# Patient Record
Sex: Male | Born: 1970 | Hispanic: Yes | Marital: Married | Smoking: Former smoker
Health system: Southern US, Community
[De-identification: ages and names within clinical notes are randomized; demographics above are authoritative.]

## PROBLEM LIST (undated history)

## (undated) DIAGNOSIS — B20 Human immunodeficiency virus [HIV] disease: Secondary | ICD-10-CM

## (undated) DIAGNOSIS — E785 Hyperlipidemia, unspecified: Secondary | ICD-10-CM

## (undated) DIAGNOSIS — R7612 Nonspecific reaction to cell mediated immunity measurement of gamma interferon antigen response without active tuberculosis: Secondary | ICD-10-CM

## (undated) DIAGNOSIS — R030 Elevated blood-pressure reading, without diagnosis of hypertension: Secondary | ICD-10-CM

## (undated) HISTORY — DX: Elevated blood-pressure reading, without diagnosis of hypertension: R03.0

## (undated) HISTORY — DX: Hyperlipidemia, unspecified: E78.5

## (undated) HISTORY — DX: Human immunodeficiency virus (HIV) disease: B20

## (undated) HISTORY — DX: Nonspecific reaction to cell mediated immunity measurement of gamma interferon antigen response without active tuberculosis: R76.12

---

## 2016-04-12 HISTORY — PX: BACK SURGERY: SHX140

## 2017-05-12 ENCOUNTER — Telehealth: Payer: Self-pay

## 2017-05-12 NOTE — Telephone Encounter (Signed)
Medical records received from [place]. Patient contacted regarding new patient appointment.  He will need to schedule appointment with financial counselor prior to visit.   Laurell Josephsammy K Craigory Toste, RN

## 2017-05-18 ENCOUNTER — Other Ambulatory Visit: Payer: Self-pay

## 2017-05-18 ENCOUNTER — Other Ambulatory Visit (HOSPITAL_COMMUNITY)
Admission: RE | Admit: 2017-05-18 | Discharge: 2017-05-18 | Disposition: A | Discharge status: Home / Self Care | Payer: Medicaid Other | Source: Ambulatory Visit | Attending: Infectious Diseases | Admitting: Infectious Diseases

## 2017-05-18 DIAGNOSIS — Z113 Encounter for screening for infections with a predominantly sexual mode of transmission: Secondary | ICD-10-CM

## 2017-05-18 DIAGNOSIS — B20 Human immunodeficiency virus [HIV] disease: Secondary | ICD-10-CM

## 2017-05-18 DIAGNOSIS — Z79899 Other long term (current) drug therapy: Secondary | ICD-10-CM

## 2017-05-18 LAB — CBC WITH DIFFERENTIAL/PLATELET
BASOS ABS: 44 {cells}/uL (ref 0–200)
BASOS PCT: 1 %
EOS ABS: 132 {cells}/uL (ref 15–500)
EOS PCT: 3 %
HCT: 40.5 % (ref 38.5–50.0)
HEMOGLOBIN: 12.6 g/dL — AB (ref 13.2–17.1)
LYMPHS ABS: 1628 {cells}/uL (ref 850–3900)
Lymphocytes Relative: 37 %
MCH: 26.3 pg — AB (ref 27.0–33.0)
MCHC: 31.1 g/dL — ABNORMAL LOW (ref 32.0–36.0)
MCV: 84.4 fL (ref 80.0–100.0)
MPV: 10.7 fL (ref 7.5–12.5)
Monocytes Absolute: 352 cells/uL (ref 200–950)
Monocytes Relative: 8 %
NEUTROS ABS: 2244 {cells}/uL (ref 1500–7800)
Neutrophils Relative %: 51 %
Platelets: 284 10*3/uL (ref 140–400)
RBC: 4.8 MIL/uL (ref 4.20–5.80)
RDW: 15.3 % — ABNORMAL HIGH (ref 11.0–15.0)
WBC: 4.4 10*3/uL (ref 3.8–10.8)

## 2017-05-18 LAB — URINALYSIS
BILIRUBIN URINE: NEGATIVE
Hgb urine dipstick: NEGATIVE
Ketones, ur: NEGATIVE
LEUKOCYTES UA: NEGATIVE
NITRITE: NEGATIVE
PROTEIN: NEGATIVE
SPECIFIC GRAVITY, URINE: 1.029 (ref 1.001–1.035)
pH: 6.5 (ref 5.0–8.0)

## 2017-05-19 LAB — HEPATITIS B SURFACE ANTIBODY,QUALITATIVE: HEP B S AB: NEGATIVE

## 2017-05-19 LAB — HEPATITIS B CORE ANTIBODY, TOTAL: Hep B Core Total Ab: NONREACTIVE

## 2017-05-19 LAB — COMPLETE METABOLIC PANEL WITH GFR
ALBUMIN: 4.3 g/dL (ref 3.6–5.1)
ALK PHOS: 97 U/L (ref 40–115)
ALT: 28 U/L (ref 9–46)
AST: 28 U/L (ref 10–40)
BILIRUBIN TOTAL: 0.3 mg/dL (ref 0.2–1.2)
BUN: 15 mg/dL (ref 7–25)
CO2: 25 mmol/L (ref 20–31)
CREATININE: 0.84 mg/dL (ref 0.60–1.35)
Calcium: 9.3 mg/dL (ref 8.6–10.3)
Chloride: 101 mmol/L (ref 98–110)
GFR, Est African American: >89 mL/min (ref >=60)
GLUCOSE: 99 mg/dL (ref 65–99)
Potassium: 4.5 mmol/L (ref 3.5–5.3)
SODIUM: 138 mmol/L (ref 135–146)
TOTAL PROTEIN: 7.5 g/dL (ref 6.1–8.1)

## 2017-05-19 LAB — T-HELPER CELL (CD4) - (RCID CLINIC ONLY)
CD4 % Helper T Cell: 29 % — ABNORMAL LOW (ref 33–55)
CD4 T Cell Abs: 470 /uL (ref 400–2700)

## 2017-05-19 LAB — LIPID PANEL
CHOL/HDL RATIO: 4 ratio (ref <5.0)
CHOLESTEROL: 214 mg/dL — AB (ref <200)
HDL: 54 mg/dL (ref >40)
LDL Cholesterol: 137 mg/dL — ABNORMAL HIGH (ref <100)
TRIGLYCERIDES: 115 mg/dL (ref <150)
VLDL: 23 mg/dL (ref <30)

## 2017-05-19 LAB — HEPATITIS C ANTIBODY: HCV Ab: NEGATIVE

## 2017-05-19 LAB — URINE CYTOLOGY ANCILLARY ONLY
Chlamydia: NEGATIVE
Neisseria Gonorrhea: NEGATIVE

## 2017-05-19 LAB — HEPATITIS A ANTIBODY, TOTAL: HEP A TOTAL AB: REACTIVE — AB

## 2017-05-19 LAB — HEPATITIS B SURFACE ANTIGEN: Hepatitis B Surface Ag: NEGATIVE

## 2017-05-19 LAB — RPR

## 2017-05-20 LAB — QUANTIFERON TB GOLD ASSAY (BLOOD)
Interferon Gamma Release Assay: POSITIVE — AB
Mitogen-Nil: 8.09 IU/mL
Quantiferon Nil Value: 0.07 IU/mL
Quantiferon Tb Ag Minus Nil Value: 2.13 IU/mL

## 2017-05-22 ENCOUNTER — Encounter: Payer: Self-pay | Admitting: Infectious Diseases

## 2017-05-25 ENCOUNTER — Ambulatory Visit (INDEPENDENT_AMBULATORY_CARE_PROVIDER_SITE_OTHER): Payer: Medicaid Other | Admitting: Infectious Diseases

## 2017-05-25 ENCOUNTER — Encounter: Payer: Self-pay | Admitting: Infectious Diseases

## 2017-05-25 VITALS — BP 150/90 | HR 73 | Temp 98.3°F | Wt 218.0 lb

## 2017-05-25 DIAGNOSIS — R7612 Nonspecific reaction to cell mediated immunity measurement of gamma interferon antigen response without active tuberculosis: Secondary | ICD-10-CM | POA: Diagnosis not present

## 2017-05-25 DIAGNOSIS — Z227 Latent tuberculosis: Secondary | ICD-10-CM | POA: Insufficient documentation

## 2017-05-25 DIAGNOSIS — E7800 Pure hypercholesterolemia, unspecified: Secondary | ICD-10-CM | POA: Insufficient documentation

## 2017-05-25 DIAGNOSIS — B20 Human immunodeficiency virus [HIV] disease: Secondary | ICD-10-CM | POA: Diagnosis not present

## 2017-05-25 DIAGNOSIS — G8929 Other chronic pain: Secondary | ICD-10-CM | POA: Diagnosis not present

## 2017-05-25 DIAGNOSIS — Z21 Asymptomatic human immunodeficiency virus [HIV] infection status: Secondary | ICD-10-CM

## 2017-05-25 HISTORY — DX: Asymptomatic human immunodeficiency virus (hiv) infection status: Z21

## 2017-05-25 HISTORY — DX: Human immunodeficiency virus (HIV) disease: B20

## 2017-05-25 HISTORY — DX: Nonspecific reaction to cell mediated immunity measurement of gamma interferon antigen response without active tuberculosis: R76.12

## 2017-05-25 LAB — HLA B*5701: HLA-B 5701 W/RFLX HLA-B HIGH: POSITIVE — AB

## 2017-05-25 LAB — HIV-1 RNA,QN PCR W/REFLEX GENOTYPE
HIV-1 RNA, QN PCR: 2.39 {Log_copies}/mL — AB
HIV-1 RNA, QN PCR: 247 {copies}/mL — AB

## 2017-05-25 MED ORDER — EMTRICITABINE-TENOFOVIR AF 200-25 MG PO TABS: 1.0000 | ORAL_TABLET | Freq: Every day | ORAL | 5 refills | Status: DC

## 2017-05-25 MED ORDER — GEMFIBROZIL 600 MG PO TABS: 600.0000 mg | ORAL_TABLET | Freq: Two times a day (BID) | ORAL | 2 refills | Status: DC

## 2017-05-25 MED ORDER — DARUNAVIR-COBICISTAT 800-150 MG PO TABS: 1.0000 | ORAL_TABLET | Freq: Every day | ORAL | 5 refills | Status: DC
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
## 2017-05-25 NOTE — Assessment & Plan Note (Signed)
I will continue his gemfibrozil for now and reassess lipid panel in 6 months. Overall cholesterol panel looks OK. Triglycerides are controlled on current regimen however LDL 137. May need to change to low dose statin for better LDL reduction.

## 2017-05-25 NOTE — Patient Instructions (Addendum)
Stop taking your RITONAVIR, TRUVADA, ATAZANAVIR.   We are going to start you on 2 new medications today PREZCOBIX and DESCOVY.   Please take these medications once a day together with a full meal.   We will see you back in 3 weeks to see how you are doing with your medications.

## 2017-05-25 NOTE — Assessment & Plan Note (Addendum)
Uncertain if he has had treatment for LTBI in the past or not. Based on his report it seems as if he possibly tried treatment but was not tolerant. Will query previous clinic if there is more information regarding this. For now no active symptoms. CXR last in 2017 that was negative by report. Will order CXR for baseline assessment here.

## 2017-05-25 NOTE — Assessment & Plan Note (Addendum)
In looking at recent lab work from [place] he has had low level viremia  ~ 100 - 110 copies on current regimen. Reports excellent adherence prior to finishing last available medication. VL and genotype still pending. I have requested further records re: resistance testing from [place] clinic. Currently awaiting VL and genotype from blood work last week. Will transition him to Descovy/Prezcobix for now. Discussed PrEP possibility for his wife--currently uninsured but seeking options for coverage now. Will discuss again at future visits. Encouraged abstinence while off medications to protect wife from transmission of virus. Vaccination records reviewed and currently up to date.

## 2017-06-15 ENCOUNTER — Ambulatory Visit (INDEPENDENT_AMBULATORY_CARE_PROVIDER_SITE_OTHER): Payer: Medicaid Other | Admitting: Infectious Diseases

## 2017-06-15 ENCOUNTER — Ambulatory Visit
Admission: RE | Admit: 2017-06-15 | Discharge: 2017-06-15 | Disposition: A | Discharge status: Home / Self Care | Payer: Medicaid Other | Source: Ambulatory Visit | Attending: Infectious Diseases | Admitting: Infectious Diseases

## 2017-06-15 ENCOUNTER — Encounter: Payer: Self-pay | Admitting: Infectious Diseases

## 2017-06-15 VITALS — BP 165/95 | HR 61 | Temp 98.0°F | Wt 220.0 lb

## 2017-06-15 DIAGNOSIS — R030 Elevated blood-pressure reading, without diagnosis of hypertension: Secondary | ICD-10-CM | POA: Diagnosis not present

## 2017-06-15 DIAGNOSIS — G8929 Other chronic pain: Secondary | ICD-10-CM | POA: Diagnosis not present

## 2017-06-15 DIAGNOSIS — B20 Human immunodeficiency virus [HIV] disease: Secondary | ICD-10-CM | POA: Diagnosis present

## 2017-06-15 DIAGNOSIS — E7800 Pure hypercholesterolemia, unspecified: Secondary | ICD-10-CM

## 2017-06-15 DIAGNOSIS — R7611 Nonspecific reaction to tuberculin skin test without active tuberculosis: Secondary | ICD-10-CM

## 2017-06-15 DIAGNOSIS — R7612 Nonspecific reaction to cell mediated immunity measurement of gamma interferon antigen response without active tuberculosis: Secondary | ICD-10-CM

## 2017-06-15 DIAGNOSIS — I10 Essential (primary) hypertension: Secondary | ICD-10-CM | POA: Insufficient documentation

## 2017-06-15 DIAGNOSIS — Z227 Latent tuberculosis: Secondary | ICD-10-CM

## 2017-06-15 HISTORY — DX: Elevated blood-pressure reading, without diagnosis of hypertension: R03.0

## 2017-06-15 LAB — LIPID PANEL
CHOL/HDL RATIO: 4.5 ratio (ref <5.0)
CHOLESTEROL: 236 mg/dL — AB (ref <200)
HDL: 53 mg/dL (ref >40)
LDL Cholesterol: 161 mg/dL — ABNORMAL HIGH (ref <100)
Triglycerides: 110 mg/dL (ref <150)
VLDL: 22 mg/dL (ref <30)

## 2017-06-15 MED ORDER — GABAPENTIN 300 MG PO CAPS: 300.0000 mg | ORAL_CAPSULE | Freq: Two times a day (BID) | ORAL | 3 refills | Status: DC

## 2017-06-15 MED ORDER — PAROXETINE HCL 20 MG PO TABS: 20.0000 mg | ORAL_TABLET | Freq: Every day | ORAL | 5 refills | Status: DC

## 2017-06-15 NOTE — Progress Notes (Signed)
PCP - Patient, No Pcp Per   Patient Active Problem List   Diagnosis Date Noted  . Elevated blood pressure reading 06/15/2017  . HIV (human immunodeficiency virus infection) (HCC) 05/25/2017  . Hypercholesteremia 05/25/2017  . 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 Swallow whole. Do NOT crush, break or chew tablets. Take with food.   EMTRICITABINE-TENOFOVIR AF (DESCOVY) 200-25 MG TABLET    Take 1 tablet by mouth daily.   GEMFIBROZIL (LOPID) 600 MG TABLET    Take 1 tablet (600 mg total) by mouth 2 (two) times daily before a meal.   OXYCODONE-ACETAMINOPHEN (PERCOCET) 10-325 MG TABLET    Take 1 tablet by mouth every 4 (four) hours as needed for pain.  Modified Medications   Modified Medication Previous Medication   GABAPENTIN (NEURONTIN) 300 MG CAPSULE gabapentin (NEURONTIN) 300 MG capsule      Take 1 capsule (300 mg total) by mouth 2 (two) times daily.    Take 300 mg by mouth 2 (two) times daily.   PAROXETINE (PAXIL) 20 MG TABLET PARoxetine (PAXIL) 20 MG tablet      Take 1 tablet (20 mg total) by mouth daily.    Take 20 mg by mouth daily.  Discontinued Medications   No medications on file    Subjective: Wille Kneebone presents to clinic today for routine follow up care for his HIV infection.  HPI:  HIV =  Currently on a regimen of Descovy and Prezcobix and is tolerating it well. Over the last 30 days he has missed no doses and is taking it with food. No concerns regarding medications or HIV disease. Symptoms associated with his HIV today include none. Last viral load was just above 200 and CD4 counts have been excellent. Requesting assistance with dental needs today. Infrequent condom use with his wife whom is HIV negative. No new sexual partners.   Health Maintenance = reviewed  vaccines and age recommended screenings today with [place]. Denies smoking and excessive alcohol use. No formal exercise. High blood pressure previously but does not recall medication use or not.   Review of Systems  Constitutional: Negative for chills, fever, malaise/fatigue and weight loss.  HENT: Negative for sore throat.        Reports loosening of bridge and needing to "glue" things back into place.   Respiratory: Negative for cough and sputum production.   Cardiovascular: Negative for chest pain and leg swelling.  Gastrointestinal: Negative for abdominal pain, diarrhea and vomiting.  Genitourinary: Negative for dysuria and flank pain.  Musculoskeletal: Negative for joint pain, myalgias and neck pain.  Skin: Negative for rash.  Neurological: Negative for dizziness, tingling and headaches.  Psychiatric/Behavioral: Negative for depression and substance abuse. The patient is not nervous/anxious and does not have insomnia.     Past Medical History:  Diagnosis Date  . 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 Elevated blood pressure reading 06/15/2017  . HIV (human immunodeficiency virus infection) (HCC) 05/25/2017   Dx 2005  . Hyperlipidemia     Allergies  Allergen Reactions  . Other     Sweet Peppers and arugala-- Hives and angioedema.     Objective:  Vitals:   06/15/17 0930  BP: (!) 165/95  Pulse: 61  Temp: 98 F (36.7 C)  TempSrc: Oral  Weight: 220 lb (99.8 kg)   There is no height or weight on file to calculate BMI.  Physical Exam  Constitutional: He is oriented to person, place, and time and well-developed, well-nourished, and in no distress.  HENT:  Mouth/Throat: No oral lesions. Normal dentition. No dental caries.  Eyes: No scleral icterus.  Cardiovascular: Normal rate, regular rhythm and normal heart sounds.   Pulmonary/Chest: Effort normal and breath sounds normal.  Abdominal: Soft. He exhibits no distension. There is no  tenderness.  Lymphadenopathy:    He has no cervical adenopathy.  Neurological: He is alert and oriented to person, place, and time.  Skin: Skin is warm and dry. No rash noted.  Psychiatric: Mood and affect normal.    Lab Results Lab Results  Component Value Date   WBC 4.4 05/18/2017   HGB 12.6 (L) 05/18/2017   HCT 40.5 05/18/2017   MCV 84.4 05/18/2017   PLT 284 05/18/2017    Lab Results  Component Value Date   CREATININE 0.84 05/18/2017   BUN 15 05/18/2017   NA 138 05/18/2017   K 4.5 05/18/2017   CL 101 05/18/2017   CO2 25 05/18/2017    Lab Results  Component Value Date   ALT 28 05/18/2017   AST 28 05/18/2017   ALKPHOS 97 05/18/2017   BILITOT 0.3 05/18/2017    Lab Results  Component Value Date   CHOL 214 (H) 05/18/2017   HDL 54 05/18/2017   LDLCALC 137 (H) 05/18/2017   TRIG 115 05/18/2017   CHOLHDL 4.0 05/18/2017   CD4 T Cell Abs (/uL)  Date Value  05/18/2017 470   Lab Results  Component Value Date   HAV REACTIVE (A) 05/18/2017   Lab Results  Component Value Date   HEPBSAG NEGATIVE 05/18/2017   HEPBSAB NEG 05/18/2017   Lab Results  Component Value Date   HCVAB NEGATIVE 05/18/2017   Lab Results  Component Value Date   CHLAMYDIAWP Negative 05/18/2017   N Negative 05/18/2017   No results found for: GCPROBEAPT Lab Results  Component Value Date   QUANTGOLD POSITIVE (A) 05/18/2017   No results found for: RPR    Problem List Items Addressed This Visit      Other   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 CXR negative at that time. Completed INH x 5088m on 08/26/2010. Will get baseline CXR today. No active pulmonary symptoms today.       Hypercholesteremia    Continues Gemfibrozil. Will reassess with next lab-draw in 4 months since his ART regimen has changed. Overall CV risk reduction low being his HDL is good and non-smoker. May need to  change to statin to get more LDL reduction.       Relevant Orders   Lipid panel   Ambulatory referral to Internal Medicine   HIV (human immunodeficiency virus infection) (HCC) - Primary (Chronic)    Tolerating his Descovy / Prezcobix well and reports excellent adherence. Will continue these medications until he is suppressed and consider switching to STR to help his cholesterol profile. No genosure available through previous clinic records. Will get him dental visit scheduled for dental complaints.       Relevant Orders   HIV 1 RNA quant-no reflex-bld   T-helper cell (CD4)- ([facility] clinic only)   Elevated blood pressure reading    BP elevated today as well  as previous visit a few weeks ago. If elevated at next visit will initiate medication for him to lower overall CV risk. Encouraged to monitor periodically at pharmacy / [facility] outside of medical setting to get better idea of trends day to day.       Relevant Orders   Ambulatory referral to Internal Medicine    Other Visit Diagnoses    Other chronic pain       Relevant Medications   PARoxetine (PAXIL) 20 MG tablet   gabapentin (NEURONTIN) 300 MG capsule   Other Relevant Orders   Ambulatory referral to Internal Medicine     Requesting assistance for non-HIV medications today as his work has been slow. Provided with Good Rx card. He has [place] Medicaid. I have placed referral to internal medicine to establish for primary care needs. I have refilled his Paxil and Neurontin for generic rx until he establishes care. He reports he is also taking opoid although uncertain as to which one. Review of [facility] shows 2 fills since April 21 2017, last July 18th for Oxycodone 10 mg #120 by Oval Linsey, MD - advised that I cannot refill this medication.    Rexene Alberts, MSN, NP-C [facility]  06/15/17 12:19 PM

## 2017-06-15 NOTE — Patient Instructions (Addendum)
Nice to see you today   Continue taking your Descovy and Prezcobix every day. ALWAYS take them together with food.   We will see you back in 3 months with labs 2 weeks before - please come fasting (no foods 8 hours before your labs)

## 2017-06-15 NOTE — Assessment & Plan Note (Addendum)
BP elevated today as well as previous visit a few weeks ago. If elevated at next visit will initiate medication for him to lower overall CV risk. Encouraged to monitor periodically at pharmacy / [facility] outside of medical setting to get better idea of trends day to day.

## 2017-06-15 NOTE — Assessment & Plan Note (Signed)
Continues Gemfibrozil. Will reassess with next lab-draw in 4 months since his ART regimen has changed. Overall CV risk reduction low being his HDL is good and non-smoker. May need to change to statin to get more LDL reduction.

## 2017-06-15 NOTE — Assessment & Plan Note (Addendum)
Tolerating his Descovy / Prezcobix well and reports excellent adherence. Will continue these medications until he is suppressed and consider switching to STR to help his cholesterol profile. No genosure available through previous clinic records. Will get him dental visit scheduled for dental complaints.

## 2017-06-15 NOTE — Assessment & Plan Note (Signed)
[facility] [place] proving he had a positive TST 06/09/2008 @ 20mm. CXR negative at that time. Completed INH x 7327m on 08/26/2010. Will get baseline CXR today. No active pulmonary symptoms today.

## 2017-06-16 LAB — T-HELPER CELL (CD4) - (RCID CLINIC ONLY)
CD4 T CELL ABS: 400 /uL (ref 400–2700)
CD4 T CELL HELPER: 27 % — AB (ref 33–55)

## 2017-06-17 ENCOUNTER — Telehealth: Payer: Self-pay | Admitting: *Deleted

## 2017-06-17 LAB — HIV-1 RNA QUANT-NO REFLEX-BLD
HIV 1 RNA QUANT: DETECTED {copies}/mL — AB
HIV-1 RNA QUANT, LOG: DETECTED {Log_copies}/mL — AB

## 2017-06-17 NOTE — Telephone Encounter (Signed)
Call from patient stating he went to the pharmacy to pick up his meds and the oxycodone was not there. Advised that this medication was never prescribed by this office and that his NP explained to him at the visit that she would not be able to refill it. He misunderstood and thanked me and accepted this explanation.

## 2017-07-21 ENCOUNTER — Other Ambulatory Visit (HOSPITAL_COMMUNITY): Payer: Self-pay | Admitting: Family Medicine

## 2017-07-21 ENCOUNTER — Ambulatory Visit (HOSPITAL_COMMUNITY)
Admission: RE | Admit: 2017-07-21 | Discharge: 2017-07-21 | Disposition: A | Discharge status: Home / Self Care | Payer: Medicaid Other | Source: Ambulatory Visit | Attending: Family Medicine | Admitting: Family Medicine

## 2017-07-21 DIAGNOSIS — G8929 Other chronic pain: Secondary | ICD-10-CM | POA: Insufficient documentation

## 2017-07-21 DIAGNOSIS — M4802 Spinal stenosis, cervical region: Secondary | ICD-10-CM | POA: Insufficient documentation

## 2017-07-21 DIAGNOSIS — M5031 Other cervical disc degeneration,  high cervical region: Secondary | ICD-10-CM | POA: Diagnosis not present

## 2017-08-28 ENCOUNTER — Other Ambulatory Visit: Payer: Self-pay | Admitting: Infectious Diseases

## 2017-08-28 DIAGNOSIS — E7800 Pure hypercholesterolemia, unspecified: Secondary | ICD-10-CM

## 2017-11-06 ENCOUNTER — Other Ambulatory Visit: Payer: Self-pay | Admitting: Infectious Diseases

## 2017-11-06 DIAGNOSIS — B20 Human immunodeficiency virus [HIV] disease: Secondary | ICD-10-CM

## 2017-12-30 ENCOUNTER — Ambulatory Visit (HOSPITAL_COMMUNITY)
Admission: RE | Admit: 2017-12-30 | Discharge: 2017-12-30 | Disposition: A | Discharge status: Home / Self Care | Payer: Medicaid Other | Source: Ambulatory Visit | Attending: Family Medicine | Admitting: Family Medicine

## 2017-12-30 ENCOUNTER — Other Ambulatory Visit (HOSPITAL_COMMUNITY): Payer: Self-pay | Admitting: Family Medicine

## 2017-12-30 DIAGNOSIS — M1711 Unilateral primary osteoarthritis, right knee: Secondary | ICD-10-CM

## 2017-12-30 DIAGNOSIS — G8929 Other chronic pain: Secondary | ICD-10-CM | POA: Diagnosis present

## 2017-12-30 DIAGNOSIS — M25561 Pain in right knee: Secondary | ICD-10-CM | POA: Insufficient documentation

## 2018-02-06 ENCOUNTER — Other Ambulatory Visit: Payer: Self-pay | Admitting: Infectious Diseases

## 2018-02-06 DIAGNOSIS — E7800 Pure hypercholesterolemia, unspecified: Secondary | ICD-10-CM

## 2018-03-11 ENCOUNTER — Other Ambulatory Visit: Payer: Self-pay | Admitting: Infectious Diseases

## 2018-03-11 DIAGNOSIS — E7800 Pure hypercholesterolemia, unspecified: Secondary | ICD-10-CM

## 2018-03-23 IMAGING — DX DG CHEST 2V
2 series · 2 of 2 positions shown · non-contrast
Comparison: None.

CLINICAL DATA: Positive QuantiFERON TB test. No active pulmonary
symptoms. Baseline assessment.

EXAM:
CHEST  2 VIEW

[dg chest 2 view (1 of 2)]
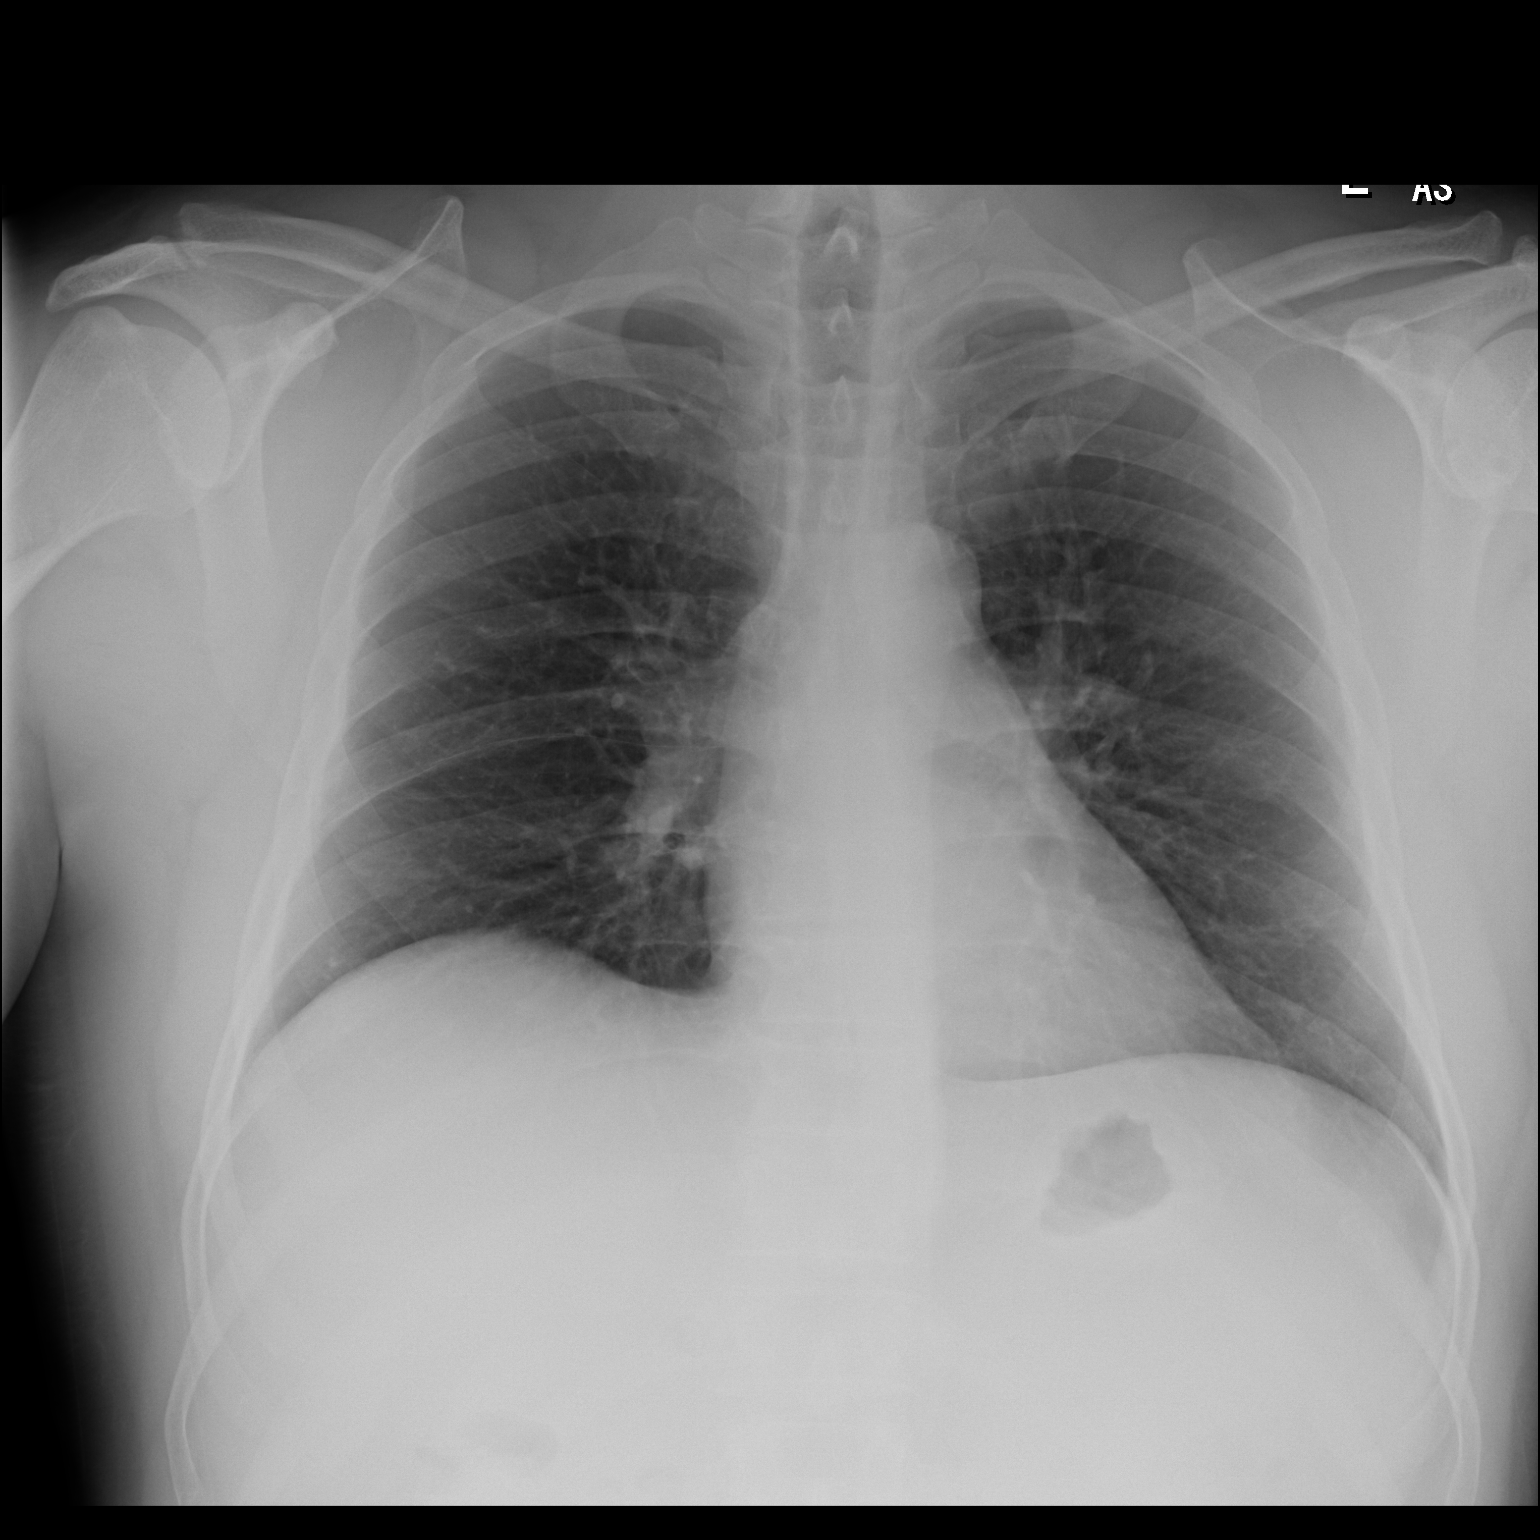

[dg chest 2 view (2 of 2)]
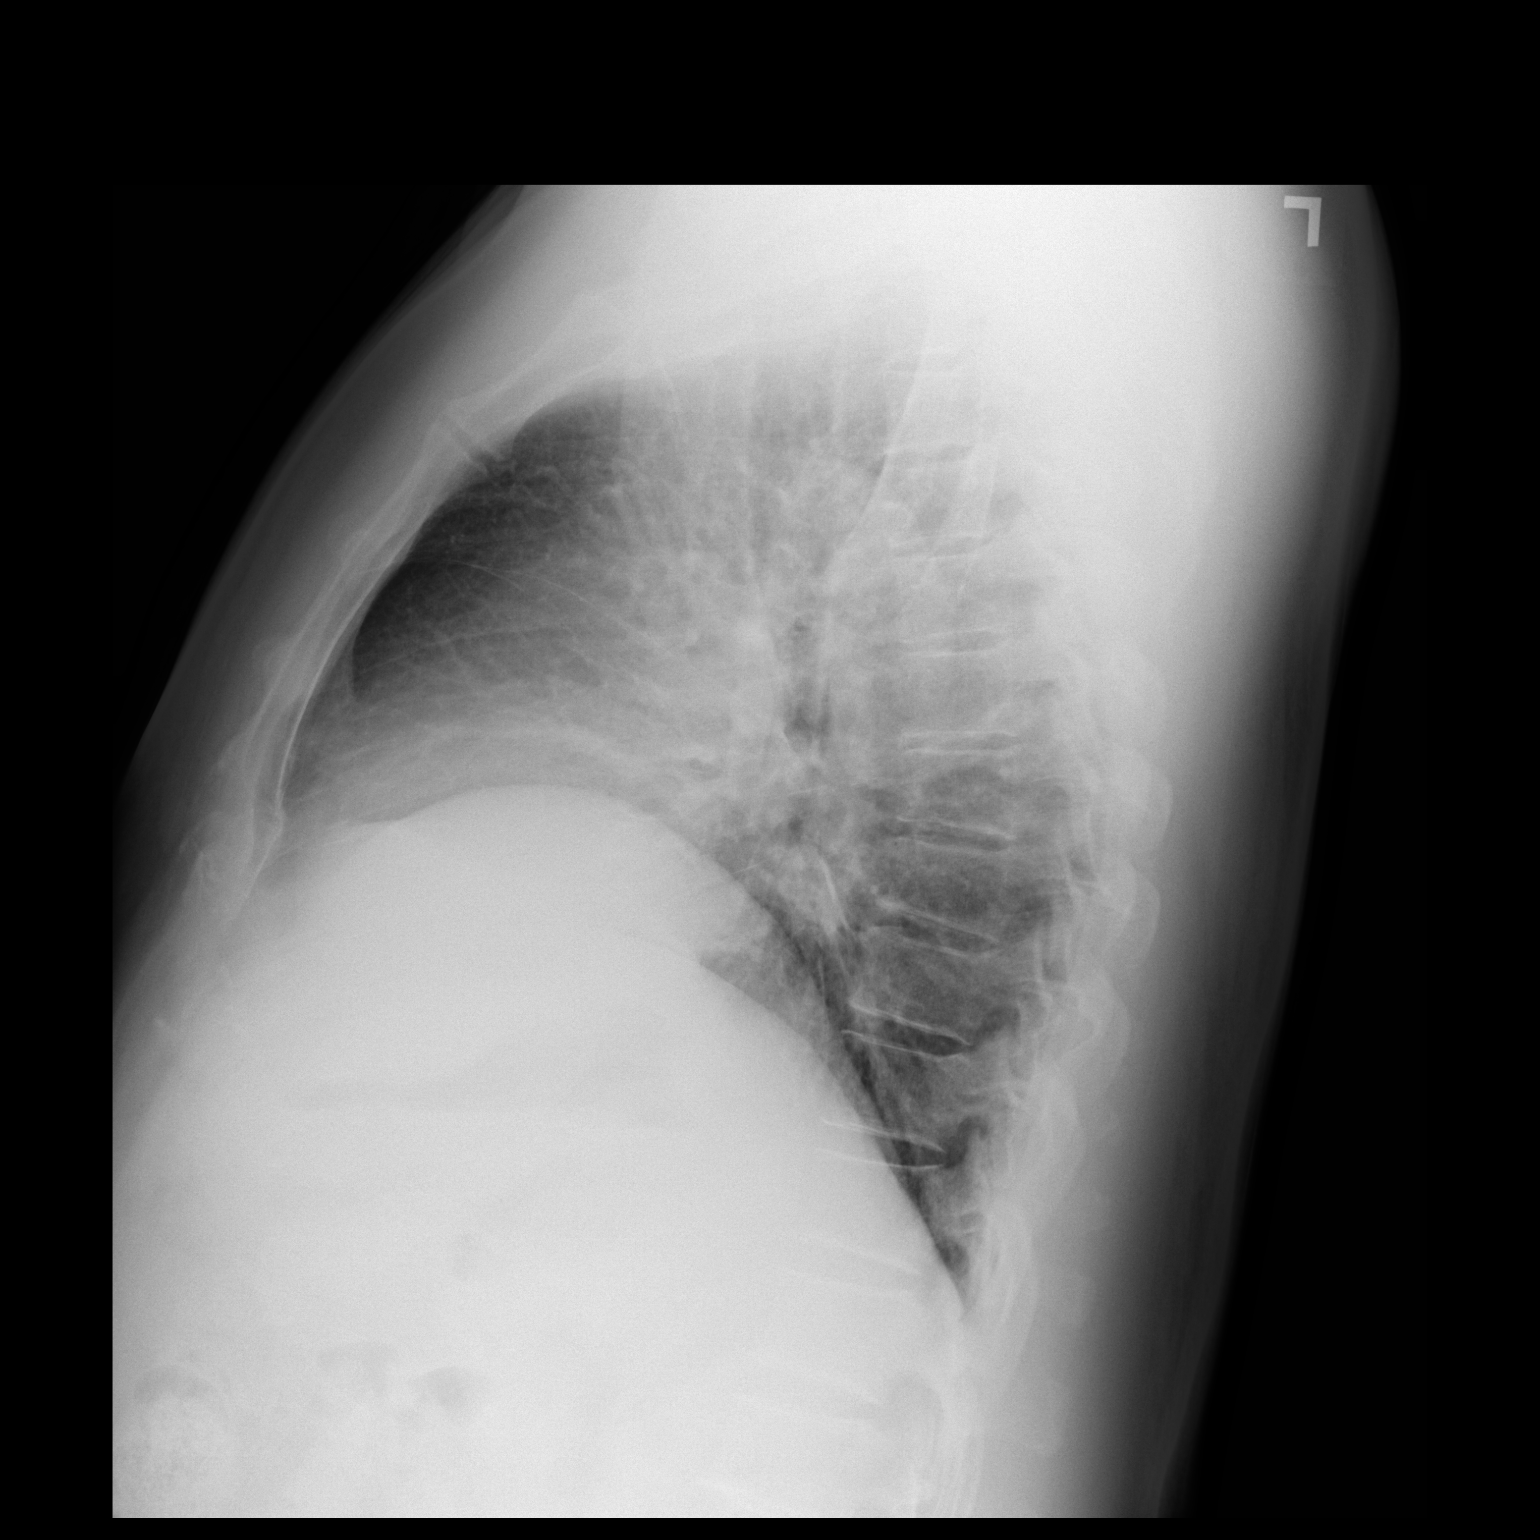

[2 of 2 positions shown; findings below may reference images not displayed]

FINDINGS: Hypoinspiratory lateral view. Normal heart size. Normal mediastinal
contour. No pneumothorax. No pleural effusion. Lungs appear clear,
with no acute consolidative airspace disease and no pulmonary edema.
IMPRESSION: No active cardiopulmonary disease.

## 2018-03-31 ENCOUNTER — Other Ambulatory Visit: Payer: Medicaid Other

## 2018-03-31 ENCOUNTER — Other Ambulatory Visit: Payer: Self-pay | Admitting: Behavioral Health

## 2018-03-31 ENCOUNTER — Other Ambulatory Visit (HOSPITAL_COMMUNITY)
Admission: RE | Admit: 2018-03-31 | Discharge: 2018-03-31 | Disposition: A | Discharge status: Home / Self Care | Payer: Medicaid Other | Source: Ambulatory Visit | Attending: Infectious Diseases | Admitting: Infectious Diseases

## 2018-03-31 DIAGNOSIS — Z113 Encounter for screening for infections with a predominantly sexual mode of transmission: Secondary | ICD-10-CM | POA: Insufficient documentation

## 2018-03-31 DIAGNOSIS — E7800 Pure hypercholesterolemia, unspecified: Secondary | ICD-10-CM

## 2018-03-31 DIAGNOSIS — B20 Human immunodeficiency virus [HIV] disease: Secondary | ICD-10-CM

## 2018-03-31 MED ORDER — EMTRICITABINE-TENOFOVIR AF 200-25 MG PO TABS
1.0000 | ORAL_TABLET | Freq: Every day | ORAL | 0 refills | Status: DC
Start: 2018-03-31 — End: 2018-04-12

## 2018-03-31 MED ORDER — GEMFIBROZIL 600 MG PO TABS: ORAL_TABLET | ORAL | 0 refills | Status: DC

## 2018-03-31 MED ORDER — DARUNAVIR-COBICISTAT 800-150 MG PO TABS: ORAL_TABLET | ORAL | 0 refills | Status: DC

## 2018-03-31 NOTE — Progress Notes (Signed)
Patient came in today for labs and stated he needed refills on his medications.  Patient states he has not missed any doses of currently HIV medications.  Patient also inquired about percocet which he receives from his primary care doctor.  Patient states his current dosage is not strong enough.  Writer informed him to talk with his primary care doctor who manages his current dosage of percocet. Patient has a scheduled f/u appointment with Rexene Alberts 04/12/2018.   Angeline Slim RN

## 2018-04-01 LAB — COMPLETE METABOLIC PANEL WITH GFR
AG RATIO: 1.5 (calc) (ref 1.0–2.5)
ALBUMIN MSPROF: 4.5 g/dL (ref 3.6–5.1)
ALT: 32 U/L (ref 9–46)
AST: 32 U/L (ref 10–40)
Alkaline phosphatase (APISO): 87 U/L (ref 40–115)
BUN: 17 mg/dL (ref 7–25)
CALCIUM: 9.5 mg/dL (ref 8.6–10.3)
CHLORIDE: 100 mmol/L (ref 98–110)
CO2: 30 mmol/L (ref 20–32)
Creat: 1.04 mg/dL (ref 0.60–1.35)
GFR, EST AFRICAN AMERICAN: 101 mL/min/{1.73_m2} (ref >=60)
GFR, EST NON AFRICAN AMERICAN: 87 mL/min/{1.73_m2} (ref >=60)
Globulin: 3 g/dL (calc) (ref 1.9–3.7)
Glucose, Bld: 142 mg/dL — ABNORMAL HIGH (ref 65–99)
Potassium: 4.1 mmol/L (ref 3.5–5.3)
Sodium: 137 mmol/L (ref 135–146)
TOTAL PROTEIN: 7.5 g/dL (ref 6.1–8.1)
Total Bilirubin: 0.4 mg/dL (ref 0.2–1.2)

## 2018-04-01 LAB — CBC WITH DIFFERENTIAL/PLATELET
BASOS ABS: 50 {cells}/uL (ref 0–200)
Basophils Relative: 0.8 %
EOS ABS: 88 {cells}/uL (ref 15–500)
EOS PCT: 1.4 %
HEMATOCRIT: 37.6 % — AB (ref 38.5–50.0)
HEMOGLOBIN: 12.3 g/dL — AB (ref 13.2–17.1)
LYMPHS ABS: 2148 {cells}/uL (ref 850–3900)
MCH: 26.3 pg — AB (ref 27.0–33.0)
MCHC: 32.7 g/dL (ref 32.0–36.0)
MCV: 80.5 fL (ref 80.0–100.0)
MPV: 12 fL (ref 7.5–12.5)
Monocytes Relative: 9.2 %
NEUTROS PCT: 54.5 %
Neutro Abs: 3434 cells/uL (ref 1500–7800)
Platelets: 283 10*3/uL (ref 140–400)
RBC: 4.67 10*6/uL (ref 4.20–5.80)
RDW: 14.5 % (ref 11.0–15.0)
Total Lymphocyte: 34.1 %
WBC mixed population: 580 cells/uL (ref 200–950)
WBC: 6.3 10*3/uL (ref 3.8–10.8)

## 2018-04-01 LAB — LIPID PANEL
CHOL/HDL RATIO: 4.2 (calc) (ref <5.0)
Cholesterol: 254 mg/dL — ABNORMAL HIGH (ref <200)
HDL: 61 mg/dL (ref >40)
LDL CHOLESTEROL (CALC): 151 mg/dL — AB
NON-HDL CHOLESTEROL (CALC): 193 mg/dL — AB (ref <130)
TRIGLYCERIDES: 254 mg/dL — AB (ref <150)

## 2018-04-01 LAB — RPR: RPR Ser Ql: NONREACTIVE

## 2018-04-01 LAB — URINE CYTOLOGY ANCILLARY ONLY
CHLAMYDIA, DNA PROBE: NEGATIVE
Neisseria Gonorrhea: NEGATIVE

## 2018-04-01 LAB — T-HELPER CELL (CD4) - (RCID CLINIC ONLY)
CD4 T CELL HELPER: 29 % — AB (ref 33–55)
CD4 T Cell Abs: 690 /uL (ref 400–2700)

## 2018-04-02 LAB — HIV-1 RNA QUANT-NO REFLEX-BLD
HIV 1 RNA QUANT: DETECTED {copies}/mL — AB
HIV-1 RNA QUANT, LOG: DETECTED {Log_copies}/mL — AB

## 2018-04-12 ENCOUNTER — Ambulatory Visit (INDEPENDENT_AMBULATORY_CARE_PROVIDER_SITE_OTHER): Payer: Medicaid Other | Admitting: Infectious Diseases

## 2018-04-12 ENCOUNTER — Encounter: Payer: Self-pay | Admitting: Infectious Diseases

## 2018-04-12 VITALS — BP 154/98 | HR 73 | Temp 98.0°F | Wt 236.0 lb

## 2018-04-12 DIAGNOSIS — E785 Hyperlipidemia, unspecified: Secondary | ICD-10-CM | POA: Diagnosis not present

## 2018-04-12 DIAGNOSIS — E7800 Pure hypercholesterolemia, unspecified: Secondary | ICD-10-CM

## 2018-04-12 DIAGNOSIS — B20 Human immunodeficiency virus [HIV] disease: Secondary | ICD-10-CM | POA: Diagnosis not present

## 2018-04-12 DIAGNOSIS — G8929 Other chronic pain: Secondary | ICD-10-CM

## 2018-04-12 DIAGNOSIS — R030 Elevated blood-pressure reading, without diagnosis of hypertension: Secondary | ICD-10-CM

## 2018-04-12 DIAGNOSIS — Z0000 Encounter for general adult medical examination without abnormal findings: Secondary | ICD-10-CM

## 2018-04-12 DIAGNOSIS — Z7185 Encounter for immunization safety counseling: Secondary | ICD-10-CM | POA: Insufficient documentation

## 2018-04-12 MED ORDER — BLOOD PRESSURE CUFF MISC: 1.0000 | Freq: Once | 0 refills | Status: AC

## 2018-04-12 MED ORDER — EMTRICITABINE-TENOFOVIR AF 200-25 MG PO TABS
1.0000 | ORAL_TABLET | Freq: Every day | ORAL | 0 refills | Status: DC
Start: 2018-04-12 — End: 2018-08-20

## 2018-04-12 MED ORDER — DARUNAVIR-COBICISTAT 800-150 MG PO TABS: ORAL_TABLET | ORAL | 11 refills | Status: DC

## 2018-04-12 MED ORDER — ROSUVASTATIN CALCIUM 5 MG PO TABS: 5.0000 mg | ORAL_TABLET | Freq: Every day | ORAL | 5 refills | Status: DC

## 2018-04-12 NOTE — Assessment & Plan Note (Signed)
Continue gabapentin 300 mg BID and paxil 20 mg QD for adjuvent pain management. Opioids are managed by pain doctor.

## 2018-04-12 NOTE — Assessment & Plan Note (Signed)
Lab Results  Component Value Date   CHOL 254 (H) 03/31/2018   HDL 61 03/31/2018   LDLCALC 151 (H) 03/31/2018   TRIG 254 (H) 03/31/2018   CHOLHDL 4.2 03/31/2018   CVD 10-year risk 10.3%. Will D/C gemfibrozil and start statin. Will initiate @ 5 mg QD for now considering he is on cobicistat for his HIV infection and interaction noted to boost effect of statin when combined to uncertain degree. (Maximum dose should be 10 mg). Discussed side effects to call to report.  Will work on getting him set up with primary care team to assist with management of this as well as his blood pressure and other conditions.

## 2018-04-12 NOTE — Assessment & Plan Note (Signed)
Will send in rx for automatic BP cuff although uncertain if Medicaid will cover this for him. I asked about the possibility of him buying one from local pharmacy as his wife also has hypertension. Would recommend monitoring at home to see what resting trend is. Likely related to weight gain. Discussed today reducing sugars/simple carbohydrates/flour products to help reduce weight. He is open to starting medication if needed. We discussed desired target < 140/90. He will notify our team if he has readings persistently above these and will start him on lisinopril/hctz combo.

## 2018-04-12 NOTE — Assessment & Plan Note (Signed)
Referral to internal medicine center for assistance in managing other conditions.  Vaccines up to date including Prevnar vaccine.  Recommended 5-10% weight loss as he has gained 20 lbs over the last 10 months.  Reduce simple carbohydrates, sodas, sugars. Increase cardiovascular exercise.
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
## 2018-04-12 NOTE — Assessment & Plan Note (Addendum)
Recent labs undetectable with Descovy + Prezcobix. I would like to get him off cobicistat if possible to avoid interactions with other medications as he likely will require other medications to treat metabolic conditions. Will see how he does with weight loss to impact some of his numbers. Discussed U=U concept with regards to he and his wife of whom they are sexually active. Risk with sustained virologic suppression essentially zero. Return in 6 months - he has not per his report (and review of pill chart) been on integrase in the past. Consider medication change at this visit.

## 2018-04-12 NOTE — Patient Instructions (Addendum)
Continue Prezcobix and Descovy once daily with food.   Would have you check your blood pressure a few times a week to see if we need to put you on blood pressure medications.   Would like to see your blood pressure less than 140/90.   Try to increase your exercise - boxing is fabulous!    I would like you to stop taking the gemfibrozil and start taking rosuvastatin once a day. This is your cholesterol.   Please come back in 6 months for fasting lab work (nothing to eat 8 hours before your test please. OK to have black coffee or water).   I would like to get you set up with a primary care provider that can help you with managing your blood pressure and cholesterol better.

## 2018-08-19 ENCOUNTER — Other Ambulatory Visit: Payer: Self-pay | Admitting: Infectious Diseases

## 2018-08-19 DIAGNOSIS — B20 Human immunodeficiency virus [HIV] disease: Secondary | ICD-10-CM

## 2018-08-23 ENCOUNTER — Telehealth: Payer: Self-pay | Admitting: Pharmacist

## 2018-08-23 DIAGNOSIS — B20 Human immunodeficiency virus [HIV] disease: Secondary | ICD-10-CM

## 2018-08-23 MED ORDER — BICTEGRAVIR-EMTRICITAB-TENOFOV 50-200-25 MG PO TABS: 1.0000 | ORAL_TABLET | Freq: Every day | ORAL | 5 refills | Status: DC

## 2018-08-23 MED FILL — BIKTARVY 50-200-25 MG TABS: 50-200-25 | 30 days supply | Qty: 30 | Fill #0

## 2018-08-23 NOTE — Addendum Note (Signed)
Addended by: Aggie Cosier L on: 08/23/2018 10:21 AM   Modules accepted: Orders

## 2018-08-23 NOTE — Telephone Encounter (Signed)
Thank you - he is seeing me again in about 6-8 weeks so can follow up viral load at this expected visit after medication switch.

## 2018-08-23 NOTE — Telephone Encounter (Signed)
Patient came in to see Olegario Messier to get ADAP and is out of Prezcobix and Descovy and will need medication assistance.  Upon looking at patient's chart, it looks like Judeth Cornfield wanted to switch him to an integrase containing regimen. Discussed with Judeth Cornfield and we will switch to [place] today. Betty applied through [place] and he got immediate approval for 30 days.

## 2018-08-24 ENCOUNTER — Encounter: Payer: Self-pay | Admitting: Infectious Diseases

## 2018-10-12 ENCOUNTER — Other Ambulatory Visit: Payer: Medicaid Other

## 2018-10-12 ENCOUNTER — Other Ambulatory Visit: Payer: Self-pay | Admitting: *Deleted

## 2018-10-12 DIAGNOSIS — B20 Human immunodeficiency virus [HIV] disease: Secondary | ICD-10-CM

## 2018-10-12 DIAGNOSIS — E785 Hyperlipidemia, unspecified: Secondary | ICD-10-CM

## 2018-10-13 LAB — T-HELPER CELL (CD4) - (RCID CLINIC ONLY)
CD4 % Helper T Cell: 24 % — ABNORMAL LOW (ref 33–55)
CD4 T Cell Abs: 360 /uL — ABNORMAL LOW (ref 400–2700)

## 2018-10-14 LAB — HIV-1 RNA QUANT-NO REFLEX-BLD
HIV 1 RNA Quant: <20 copies/mL — AB
HIV-1 RNA Quant, Log: <1.3 Log copies/mL — AB

## 2018-10-14 LAB — LIPID PANEL
CHOL/HDL RATIO: 4.3 (calc) (ref <5.0)
Cholesterol: 224 mg/dL — ABNORMAL HIGH (ref <200)
HDL: 52 mg/dL (ref >40)
LDL Cholesterol (Calc): 152 mg/dL (calc) — ABNORMAL HIGH
Non-HDL Cholesterol (Calc): 172 mg/dL (calc) — ABNORMAL HIGH (ref <130)
Triglycerides: 92 mg/dL (ref <150)

## 2018-10-14 LAB — COMPLETE METABOLIC PANEL WITH GFR
AG Ratio: 1.4 (calc) (ref 1.0–2.5)
ALT: 25 U/L (ref 9–46)
AST: 26 U/L (ref 10–40)
Albumin: 4.4 g/dL (ref 3.6–5.1)
Alkaline phosphatase (APISO): 84 U/L (ref 40–115)
BUN: 14 mg/dL (ref 7–25)
CO2: 28 mmol/L (ref 20–32)
Calcium: 9.6 mg/dL (ref 8.6–10.3)
Chloride: 101 mmol/L (ref 98–110)
Creat: 0.93 mg/dL (ref 0.60–1.35)
GFR, Est African American: 114 mL/min/{1.73_m2} (ref >=60)
GFR, Est Non African American: 99 mL/min/{1.73_m2} (ref >=60)
Globulin: 3.1 g/dL (calc) (ref 1.9–3.7)
Glucose, Bld: 168 mg/dL — ABNORMAL HIGH (ref 65–99)
POTASSIUM: 4.3 mmol/L (ref 3.5–5.3)
Sodium: 137 mmol/L (ref 135–146)
Total Bilirubin: 0.4 mg/dL (ref 0.2–1.2)
Total Protein: 7.5 g/dL (ref 6.1–8.1)

## 2018-10-14 LAB — HEMOGLOBIN A1C
Hgb A1c MFr Bld: 6.1 % of total Hgb — ABNORMAL HIGH (ref <5.7)
Mean Plasma Glucose: 128 (calc)
eAG (mmol/L): 7.1 (calc)

## 2018-10-26 ENCOUNTER — Encounter: Payer: Self-pay | Admitting: Infectious Diseases

## 2018-10-26 ENCOUNTER — Ambulatory Visit (INDEPENDENT_AMBULATORY_CARE_PROVIDER_SITE_OTHER): Payer: Self-pay | Admitting: Infectious Diseases

## 2018-10-26 VITALS — BP 116/78 | HR 76 | Temp 98.2°F | Wt 235.0 lb

## 2018-10-26 DIAGNOSIS — Z21 Asymptomatic human immunodeficiency virus [HIV] infection status: Secondary | ICD-10-CM

## 2018-10-26 DIAGNOSIS — F4321 Adjustment disorder with depressed mood: Secondary | ICD-10-CM

## 2018-10-26 DIAGNOSIS — Z23 Encounter for immunization: Secondary | ICD-10-CM

## 2018-10-26 DIAGNOSIS — R030 Elevated blood-pressure reading, without diagnosis of hypertension: Secondary | ICD-10-CM

## 2018-10-26 DIAGNOSIS — E785 Hyperlipidemia, unspecified: Secondary | ICD-10-CM

## 2018-10-26 DIAGNOSIS — R7303 Prediabetes: Secondary | ICD-10-CM | POA: Insufficient documentation

## 2018-10-26 DIAGNOSIS — E7800 Pure hypercholesterolemia, unspecified: Secondary | ICD-10-CM

## 2018-10-26 DIAGNOSIS — G8929 Other chronic pain: Secondary | ICD-10-CM

## 2018-10-26 MED ORDER — ROSUVASTATIN CALCIUM 10 MG PO TABS: 10.0000 mg | ORAL_TABLET | Freq: Every day | ORAL | 5 refills | Status: DC

## 2018-10-26 NOTE — Assessment & Plan Note (Signed)
Well controlled and tolerating Biktarvy well with maintained viral suppression. Continue this for him.  Reviewed all labs with him today. Discussed U=U concept in addition to safe sex counseling and prevention of other STIs.   RTC 7243m to follow his depression/resource needs.

## 2018-10-26 NOTE — Assessment & Plan Note (Signed)
Managed by other provider. [facility] to get 90ct supply for $23. Provided.

## 2018-10-26 NOTE — Assessment & Plan Note (Signed)
BP looks good today - continue to monitor off meds.

## 2018-10-26 NOTE — Assessment & Plan Note (Signed)
Lab Results  Component Value Date   HGBA1C 6.1 (H) 10/12/2018   Discussed today. Glucose on recent labs 168. He drinks Red Bulls up to 5 times a day and eats liberally but tries to avoid fatty foods. We had a good discussion about carbohydrates, what they are and which to avoid. Discussed long term damage a/w diabetes and increased mortality a/w this. He will continue to work on diet changes, specifically reducing Red Bulls to 2-3 a day first.

## 2018-10-26 NOTE — Assessment & Plan Note (Signed)
Hx of chronic depression that was well maintained on paxil now with acute worsening d/t situational reaction. Discussed and offered counseling services with our team. He will think about this and make an appointment if he does not seem to be "bouncing back." No SI/HI plan/intent today.

## 2018-10-26 NOTE — Patient Instructions (Addendum)
Nice to see you again today - I hope things start improving for you soon. Sibyl ParrRegina, Roberto Woodard are here for some talk therapy if you need. I am here for you too - just takes a phone call   The coupon I gave you should get you 90 tablets of the 5 mg Oxycodone for $23.   Your blood pressure looks good today and triglycerides are much improved.   Will have you come back in 3 months to check in again and make sure things are looking up for you - no labs before.   Continue your biktarvy once a day as you are doing now

## 2018-10-26 NOTE — Progress Notes (Signed)
Name: Roberto Woodard  DOB: Aug 11, 1971 MRN: [id number] PCP: Lucia Gaskins, MD    Patient Active Problem List   Diagnosis Date Noted  . Prediabetes 10/26/2018  . Acute adjustment disorder with depressed mood 10/26/2018  . Healthcare maintenance 04/12/2018  . Chronic pain 04/12/2018  . Elevated blood pressure reading 06/15/2017  . HIV (human immunodeficiency virus infection) ([place]) 05/25/2017  . Hypercholesteremia 05/25/2017  . H/O latent tuberculosis by blood test, s/p adequate treatment 2011 05/25/2017     Brief Narrative:  [name] is a 47 y.o. male with HIV infection, non-AIDS; Dx 2005 with CD4 nadir unknown (never on OI proph). HLA B*5701 (+). HIV Risk: heterosexual. 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   Previous Regimens:  Reyataz / Noravir / Truvada --> suppressed  05/2017: Descovy / Prezcobix --> suppressed (switched to reduce hyperglycemia a/w cobi)  Biktarvy 2018 --> suppressed   Genotypes:  Records not sent after 2 requests. Undetectable recently.    Subjective:  CC:  Roberto Woodard is a here today for follow up on HIV care. He has financial troubles/stressors and worsening depression.   HPI:  Concerned about losing his Medicaid coverage recently especially now that work is slow for him. He has been approved for HMAP and receives his biktarvy, antidepressant, gabapentin and cholesterol medication through this program but cannot afford the oxycodone that he has taken for chronic back/neck pain a/w hardware. His pain medications cost him $65 for 1 month supply @ taking three 5/325 mg daily; he feels that he can make due on 2 a day when he is not working as much. It also costs him $40 to see his prescribing doctor.   Has had worsening depression with being on fixed income over the holidays and stress a/w trying to get Medicaid re-activated. He is not in care with dentist presently. Has not received flu vaccine  yet.   Depression screen PHQ 2/9 10/26/2018  Decreased Interest 1  Down, Depressed, Hopeless 1  PHQ - 2 Score 2  Altered sleeping 1  Tired, decreased energy 1  Change in appetite 1  Feeling bad or failure about yourself  1  Trouble concentrating 1  Moving slowly or fidgety/restless 1  Suicidal thoughts 1  PHQ-9 Score 9    Review of Systems  Constitutional: Negative for chills and fever.  HENT: Negative for tinnitus.   Eyes: Negative for blurred vision and photophobia.  Respiratory: Negative for cough and sputum production.   Cardiovascular: Negative for chest pain.  Gastrointestinal: Negative for diarrhea, nausea and vomiting.  Genitourinary: Negative for dysuria.  Musculoskeletal: Positive for back pain and neck pain.  Skin: Negative for rash.  Neurological: Negative for headaches.  Psychiatric/Behavioral: Positive for depression. The patient is nervous/anxious and has insomnia.     Past Medical History:  Diagnosis Date  . 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 Elevated blood pressure reading 06/15/2017  . HIV (human immunodeficiency virus infection) ([place]) 05/25/2017   Dx 2005  . Hyperlipidemia     Outpatient Medications Prior to Visit  Medication Sig Dispense Refill  . bictegravir-emtricitabine-tenofovir AF (BIKTARVY) 50-200-25 MG TABS tablet Take 1 tablet by mouth daily. 30 tablet 5  . gabapentin (NEURONTIN) 300 MG capsule Take 1 capsule (300 mg total) by mouth 2 (two) times daily. 60 capsule 3  . PARoxetine (PAXIL) 20 MG tablet Take 1 tablet (20 mg total) by mouth daily. 30 tablet 5  . rosuvastatin (CRESTOR)  5 MG tablet Take 1 tablet (5 mg total) by mouth daily. 30 tablet 5  . oxyCODONE-acetaminophen (PERCOCET) 10-325 MG tablet Take 1 tablet by mouth every 4 (four) hours as needed for pain.     No facility-administered medications prior to visit.      Allergies  Allergen Reactions  . Other     Sweet Peppers and arugala-- Hives  and angioedema.     Social History   Tobacco Use  . Smoking status: Former Smoker    Types: Cigarettes  . Smokeless tobacco: Never Used  Substance Use Topics  . Alcohol use: No    Comment: No alcohol x 18 years.   . Drug use: No    Social History   Substance and Sexual Activity  Sexual Activity Yes  . Partners: Female  . Birth control/protection: Condom    Objective:   Vitals:   10/26/18 0956  BP: 116/78  Pulse: 76  Temp: 98.2 F (36.8 C)  Weight: 235 lb (106.6 kg)   There is no height or weight on file to calculate BMI.  Physical Exam Vitals signs reviewed.  Constitutional:      Appearance: He is well-developed.     Comments: Seated comfortably in chair during visit. Appears well but stressed.   HENT:     Mouth/Throat:     Mouth: Mucous membranes are moist.     Dentition: Normal dentition. No dental abscesses.     Pharynx: Oropharynx is clear. No oropharyngeal exudate.  Eyes:     General: No scleral icterus.    Pupils: Pupils are equal, round, and reactive to light.  Neck:     Musculoskeletal: Muscular tenderness (chronic a/w previous surgery) present.  Cardiovascular:     Rate and Rhythm: Normal rate and regular rhythm.     Heart sounds: Normal heart sounds.  Pulmonary:     Effort: Pulmonary effort is normal.     Breath sounds: Normal breath sounds.  Abdominal:     General: There is no distension.     Palpations: Abdomen is soft.     Tenderness: There is no abdominal tenderness.  Skin:    General: Skin is warm and dry.     Findings: No rash.  Neurological:     Mental Status: He is alert and oriented to person, place, and time.  Psychiatric:        Judgment: Judgment normal.     Comments: In good spirits today and engaged in care discussion.      Lab Results Lab Results  Component Value Date   WBC 6.3 03/31/2018   HGB 12.3 (L) 03/31/2018   HCT 37.6 (L) 03/31/2018   MCV 80.5 03/31/2018   PLT 283 03/31/2018    Lab Results  Component  Value Date   CREATININE 0.93 10/12/2018   BUN 14 10/12/2018   NA 137 10/12/2018   K 4.3 10/12/2018   CL 101 10/12/2018   CO2 28 10/12/2018    Lab Results  Component Value Date   ALT 25 10/12/2018   AST 26 10/12/2018   ALKPHOS 97 05/18/2017   BILITOT 0.4 10/12/2018    Lab Results  Component Value Date   CHOL 224 (H) 10/12/2018   HDL 52 10/12/2018   LDLCALC 152 (H) 10/12/2018   TRIG 92 10/12/2018   CHOLHDL 4.3 10/12/2018   HIV 1 RNA Quant (copies/mL)  Date Value  10/12/2018 <20 DETECTED (A)  03/31/2018 <20 DETECTED (A)  06/15/2017 <20 DETECTED (A)   CD4 T  Cell Abs (/uL)  Date Value  10/12/2018 360 (L)  03/31/2018 690  06/15/2017 400     Assessment & Plan:   Problem List Items Addressed This Visit      Unprioritized   Prediabetes    Lab Results  Component Value Date   HGBA1C 6.1 (H) 10/12/2018   Discussed today. Glucose on recent labs 168. He drinks Red Bulls up to 5 times a day and eats liberally but tries to avoid fatty foods. We had a good discussion about carbohydrates, what they are and which to avoid. Discussed long term damage a/w diabetes and increased mortality a/w this. He will continue to work on diet changes, specifically reducing Red Bulls to 2-3 a day first.       Hypercholesteremia    Increase crestor to 10 mg QD - his triglycerides look much better but total/LDL still elevated. He is pre-diabetic and HIV+, normal BP. Discussed dietary modifications today.       Relevant Medications   rosuvastatin (CRESTOR) 10 MG tablet   HIV (human immunodeficiency virus infection) ([place]) - Primary (Chronic)    Well controlled and tolerating Biktarvy well with maintained viral suppression. Continue this for him.  Reviewed all labs with him today. Discussed U=U concept in addition to safe sex counseling and prevention of other STIs.   RTC 29mto follow his depression/resource needs.       Elevated blood pressure reading    BP looks good today - continue to  monitor off meds.       Chronic pain    Managed by other provider. [facility] coupon to get 90ct supply for $23. Provided.       Acute adjustment disorder with depressed mood    Hx of chronic depression that was well maintained on paxil now with acute worsening d/t situational reaction. Discussed and offered counseling services with our team. He will think about this and make an appointment if he does not seem to be "bouncing back." No SI/HI plan/intent today.        Other Visit Diagnoses    Hyperlipidemia, unspecified hyperlipidemia type       Relevant Medications   rosuvastatin (CRESTOR) 10 MG tablet   Need for immunization against influenza       Relevant Orders   Flu Vaccine QUAD 36+ mos IM (Completed)      SJanene Madeira MSN, NP-C [facility] DHughes SpringsPager: [phone]: [phone] 10/26/18  1:16 PM

## 2018-10-26 NOTE — Assessment & Plan Note (Signed)
Increase crestor to 10 mg QD - his triglycerides look much better but total/LDL still elevated. He is pre-diabetic and HIV+, normal BP. Discussed dietary modifications today.

## 2018-11-11 ENCOUNTER — Telehealth: Payer: Self-pay | Admitting: Internal Medicine

## 2018-11-11 NOTE — Telephone Encounter (Signed)
New patient referral from [facility] for health maintenance.  Called patient no answer left detailed message asking to please call me back to schedule an appt.

## 2019-01-20 ENCOUNTER — Encounter: Payer: Self-pay | Admitting: Infectious Diseases

## 2019-01-20 ENCOUNTER — Ambulatory Visit: Payer: Self-pay

## 2019-01-20 ENCOUNTER — Ambulatory Visit (INDEPENDENT_AMBULATORY_CARE_PROVIDER_SITE_OTHER): Payer: Medicaid Other | Admitting: Infectious Diseases

## 2019-01-20 ENCOUNTER — Other Ambulatory Visit: Payer: Self-pay

## 2019-01-20 VITALS — BP 119/76 | HR 75 | Temp 98.0°F | Wt 235.0 lb

## 2019-01-20 DIAGNOSIS — R7303 Prediabetes: Secondary | ICD-10-CM

## 2019-01-20 DIAGNOSIS — E7800 Pure hypercholesterolemia, unspecified: Secondary | ICD-10-CM

## 2019-01-20 DIAGNOSIS — Z21 Asymptomatic human immunodeficiency virus [HIV] infection status: Secondary | ICD-10-CM

## 2019-01-20 NOTE — Progress Notes (Signed)
Name: Roberto Woodard  DOB: [birth year] MRN: [id number] PCP: Lucia Gaskins, MD    Patient Active Problem List   Diagnosis Date Noted  . Prediabetes 10/26/2018  . Healthcare maintenance 04/12/2018  . Chronic pain 04/12/2018  . Elevated blood pressure reading 06/15/2017  . HIV (human immunodeficiency virus infection) ([place]) 05/25/2017  . Hypercholesteremia 05/25/2017  . 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 male with HIV infection, non-AIDS; Dx 2005 with CD4 nadir unknown (never on OI proph). HLA B*5701 (+). HIV Risk: heterosexual. 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   Previous Regimens:  Reyataz / Noravir / Truvada --> suppressed  05/2017: Descovy / Prezcobix --> suppressed (switched to reduce hyperglycemia a/w cobi)  Biktarvy 2018 --> suppressed   Genotypes:  Records not sent after 2 requests. Undetectable recently.    Subjective:  CC:  Roberto Woodard is a here today for follow up on HIV care. Has financial questions.   HPI: Doing well from HIV stand point. Roberto Woodard has continued his Biktarvy once daily w/o any missed doses. Roberto Woodard is taking no new medications. Roberto Woodard has had very regular work over the last 2 months and is so grateful for this. Roberto Woodard is now enrolled in HMAP/RW program and wondering if this will impact his ability to qualify for citizenship. Roberto Woodard has met with Juliann Pulse today to discuss as well.   Acute worsened depression we discussed last time has resolved. Roberto Woodard has otherwise been healthy w/o any illnesses or acute hospitalizations/ER visits.    Review of Systems  Constitutional: Negative for chills and fever.  HENT: Negative for tinnitus.   Eyes: Negative for blurred vision and photophobia.  Respiratory: Negative for cough and sputum production.   Cardiovascular: Negative for chest pain.  Gastrointestinal: Negative for diarrhea, nausea  and vomiting.  Genitourinary: Negative for dysuria.  Musculoskeletal: Positive for back pain and neck pain.  Skin: Negative for rash.  Neurological: Negative for headaches.  Psychiatric/Behavioral: Negative for depression. The patient is not nervous/anxious and does not have insomnia.     Past Medical History:  Diagnosis Date  . 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 Elevated blood pressure reading 06/15/2017  . HIV (human immunodeficiency virus infection) ([place]) 05/25/2017   Dx 2005  . Hyperlipidemia     Outpatient Medications Prior to Visit  Medication Sig Dispense Refill  . bictegravir-emtricitabine-tenofovir AF (BIKTARVY) 50-200-25 MG TABS tablet Take 1 tablet by mouth daily. 30 tablet 5  . gabapentin (NEURONTIN) 300 MG capsule Take 1 capsule (300 mg total) by mouth 2 (two) times daily. 60 capsule 3  . oxyCODONE-acetaminophen (PERCOCET) 10-325 MG tablet Take 1 tablet by mouth every 4 (four) hours as needed for pain.    Marland Kitchen PARoxetine (PAXIL) 20 MG tablet Take 1 tablet (20 mg total) by mouth daily. 30 tablet 5  . rosuvastatin (CRESTOR) 10 MG tablet Take 1 tablet (10 mg total) by mouth daily. 30 tablet 5   No facility-administered medications prior to visit.      Allergies  Allergen Reactions  . Other     Sweet Peppers and arugala-- Hives and angioedema.     Social History   Tobacco Use  . Smoking status: Former Smoker    Types: Cigarettes  . Smokeless tobacco: Never Used  Substance Use Topics  . Alcohol use: No    Comment: No alcohol  x 18 years.   . Drug use: No    Social History   Substance and Sexual Activity  Sexual Activity Yes  . Partners: Female  . Birth control/protection: Condom    Objective:   Vitals:   01/20/19 1208  BP: 119/76  Pulse: 75  Temp: 98 F (36.7 C)  Weight: 235 lb (106.6 kg)   There is no height or weight on file to calculate BMI.  Physical Exam Vitals signs reviewed.  Constitutional:       Appearance: Roberto Woodard is well-developed.     Comments: Seated comfortably in chair during visit. Appears well but stressed.   HENT:     Mouth/Throat:     Mouth: Mucous membranes are moist.     Dentition: Normal dentition. No dental abscesses.     Pharynx: Oropharynx is clear. No oropharyngeal exudate.  Eyes:     General: No scleral icterus.    Pupils: Pupils are equal, round, and reactive to light.  Neck:     Musculoskeletal: No muscular tenderness.  Cardiovascular:     Rate and Rhythm: Normal rate and regular rhythm.     Heart sounds: Normal heart sounds.  Pulmonary:     Effort: Pulmonary effort is normal.     Breath sounds: Normal breath sounds.  Abdominal:     General: There is no distension.     Palpations: Abdomen is soft.     Tenderness: There is no abdominal tenderness.  Skin:    General: Skin is warm and dry.     Findings: No rash.  Neurological:     Mental Status: Roberto Woodard is alert and oriented to person, place, and time.  Psychiatric:        Judgment: Judgment normal.     Comments: In good spirits today and engaged in care discussion.      Lab Results Lab Results  Component Value Date   WBC 6.3 03/31/2018   HGB 12.3 (L) 03/31/2018   HCT 37.6 (L) 03/31/2018   MCV 80.5 03/31/2018   PLT 283 03/31/2018    Lab Results  Component Value Date   CREATININE 0.93 10/12/2018   BUN 14 10/12/2018   NA 137 10/12/2018   K 4.3 10/12/2018   CL 101 10/12/2018   CO2 28 10/12/2018    Lab Results  Component Value Date   ALT 25 10/12/2018   AST 26 10/12/2018   ALKPHOS 97 05/18/2017   BILITOT 0.4 10/12/2018    Lab Results  Component Value Date   CHOL 224 (H) 10/12/2018   HDL 52 10/12/2018   LDLCALC 152 (H) 10/12/2018   TRIG 92 10/12/2018   CHOLHDL 4.3 10/12/2018   HIV 1 RNA Quant (copies/mL)  Date Value  10/12/2018 <20 DETECTED (A)  03/31/2018 <20 DETECTED (A)  06/15/2017 <20 DETECTED (A)   CD4 T Cell Abs (/uL)  Date Value  10/12/2018 360 (L)  03/31/2018 690   06/15/2017 400     Assessment & Plan:   Problem List Items Addressed This Visit      Unprioritized   HIV (human immunodeficiency virus infection) ([place]) - Primary (Chronic)    Roberto Woodard is doing very well on Biktarvy. Has been undetectable for several years. Will have him return in 27mwith labs at that time and continue with 2x/yr visits.  Discussed U=U concept in addition to safe sex counseling and prevention of other STIs. Roberto Woodard is monogamous with wife. Declined STI testing.  RTC 650m     Relevant Orders  T-helper cell (CD4)- ([facility] clinic only)   HIV-1 RNA quant-no reflex-bld   CBC   Comprehensive metabolic panel   Lipid panel   RPR   Hypercholesteremia    Check lipids next visit with fasting blood work.       Prediabetes    Check A1C next lab draw.       Relevant Orders   Hemoglobin A1c      Janene Madeira, MSN, NP-C [facility] for Infect[facility] [place] Pager: [phone] Office: [phone]  01/20/19  7:18 PM

## 2019-01-20 NOTE — Patient Instructions (Addendum)
Please continue taking your Biktarvy once a day as you are.   Will see you back in 6 months with labs before the visit   ADAP re-enrollment will be due again in July so please come back and see [place] then.

## 2019-01-20 NOTE — Assessment & Plan Note (Signed)
Check lipids next visit with fasting blood work.

## 2019-01-20 NOTE — Assessment & Plan Note (Signed)
He is doing very well on Biktarvy. Has been undetectable for several years. Will have him return in 9m with labs at that time and continue with 2x/yr visits.  Discussed U=U concept in addition to safe sex counseling and prevention of other STIs. He is monogamous with wife. Declined STI testing.  RTC 27m

## 2019-01-20 NOTE — Assessment & Plan Note (Signed)
Check A1C next lab draw.

## 2019-01-21 ENCOUNTER — Ambulatory Visit: Payer: Medicaid Other | Admitting: Infectious Diseases

## 2019-03-03 ENCOUNTER — Other Ambulatory Visit: Payer: Self-pay | Admitting: *Deleted

## 2019-03-03 DIAGNOSIS — B20 Human immunodeficiency virus [HIV] disease: Secondary | ICD-10-CM

## 2019-03-03 MED ORDER — BICTEGRAVIR-EMTRICITAB-TENOFOV 50-200-25 MG PO TABS: 1.0000 | ORAL_TABLET | Freq: Every day | ORAL | 5 refills | Status: DC

## 2019-07-20 ENCOUNTER — Ambulatory Visit: Payer: Self-pay

## 2019-07-20 ENCOUNTER — Other Ambulatory Visit: Payer: Medicaid Other

## 2019-07-20 ENCOUNTER — Other Ambulatory Visit: Payer: Self-pay

## 2019-07-20 DIAGNOSIS — Z21 Asymptomatic human immunodeficiency virus [HIV] infection status: Secondary | ICD-10-CM

## 2019-07-20 DIAGNOSIS — R7303 Prediabetes: Secondary | ICD-10-CM

## 2019-07-21 LAB — CBC
HCT: 40.3 % (ref 38.5–50.0)
Hemoglobin: 12.7 g/dL — ABNORMAL LOW (ref 13.2–17.1)
MCH: 26.3 pg — ABNORMAL LOW (ref 27.0–33.0)
MCHC: 31.5 g/dL — ABNORMAL LOW (ref 32.0–36.0)
MCV: 83.4 fL (ref 80.0–100.0)
MPV: 11.4 fL (ref 7.5–12.5)
Platelets: 301 10*3/uL (ref 140–400)
RBC: 4.83 10*6/uL (ref 4.20–5.80)
RDW: 13.9 % (ref 11.0–15.0)
WBC: 4.4 10*3/uL (ref 3.8–10.8)

## 2019-07-21 LAB — COMPREHENSIVE METABOLIC PANEL
AG Ratio: 1.3 (calc) (ref 1.0–2.5)
ALT: 39 U/L (ref 9–46)
AST: 35 U/L (ref 10–40)
Albumin: 4.3 g/dL (ref 3.6–5.1)
Alkaline phosphatase (APISO): 78 U/L (ref 36–130)
BUN: 14 mg/dL (ref 7–25)
CO2: 24 mmol/L (ref 20–32)
Calcium: 9.5 mg/dL (ref 8.6–10.3)
Chloride: 101 mmol/L (ref 98–110)
Creat: 1.03 mg/dL (ref 0.60–1.35)
Globulin: 3.2 g/dL (calc) (ref 1.9–3.7)
Glucose, Bld: 133 mg/dL — ABNORMAL HIGH (ref 65–99)
Potassium: 4.4 mmol/L (ref 3.5–5.3)
Sodium: 136 mmol/L (ref 135–146)
Total Bilirubin: 0.4 mg/dL (ref 0.2–1.2)
Total Protein: 7.5 g/dL (ref 6.1–8.1)

## 2019-07-21 LAB — LIPID PANEL
Cholesterol: 215 mg/dL — ABNORMAL HIGH (ref <200)
HDL: 50 mg/dL (ref >=40)
LDL Cholesterol (Calc): 142 mg/dL (calc) — ABNORMAL HIGH
Non-HDL Cholesterol (Calc): 165 mg/dL (calc) — ABNORMAL HIGH (ref <130)
Total CHOL/HDL Ratio: 4.3 (calc) (ref <5.0)
Triglycerides: 110 mg/dL (ref <150)

## 2019-07-21 LAB — T-HELPER CELL (CD4) - (RCID CLINIC ONLY)
CD4 % Helper T Cell: 32 % — ABNORMAL LOW (ref 33–65)
CD4 T Cell Abs: 438 /uL (ref 400–1790)

## 2019-07-21 LAB — HEMOGLOBIN A1C
Hgb A1c MFr Bld: 6.1 % of total Hgb — ABNORMAL HIGH (ref <5.7)
Mean Plasma Glucose: 128 (calc)
eAG (mmol/L): 7.1 (calc)

## 2019-07-21 LAB — RPR: RPR Ser Ql: NONREACTIVE

## 2019-07-25 ENCOUNTER — Other Ambulatory Visit: Payer: Medicaid Other

## 2019-07-26 LAB — HIV-1 RNA QUANT-NO REFLEX-BLD
HIV 1 RNA Quant: <20 copies/mL
HIV-1 RNA Quant, Log: <1.3 Log copies/mL

## 2019-08-22 ENCOUNTER — Encounter: Payer: Medicaid Other | Admitting: Infectious Diseases

## 2019-08-31 ENCOUNTER — Ambulatory Visit (INDEPENDENT_AMBULATORY_CARE_PROVIDER_SITE_OTHER): Payer: Self-pay | Admitting: Infectious Diseases

## 2019-08-31 ENCOUNTER — Encounter: Payer: Self-pay | Admitting: Infectious Diseases

## 2019-08-31 ENCOUNTER — Other Ambulatory Visit: Payer: Self-pay

## 2019-08-31 DIAGNOSIS — E7800 Pure hypercholesterolemia, unspecified: Secondary | ICD-10-CM

## 2019-08-31 DIAGNOSIS — B20 Human immunodeficiency virus [HIV] disease: Secondary | ICD-10-CM

## 2019-08-31 DIAGNOSIS — Z23 Encounter for immunization: Secondary | ICD-10-CM

## 2019-08-31 MED ORDER — BIKTARVY 50-200-25 MG PO TABS: 1.0000 | ORAL_TABLET | Freq: Every day | ORAL | 11 refills | Status: DC

## 2019-08-31 NOTE — Progress Notes (Signed)
Name: Roberto Woodard  DOB: [birth year] MRN: [id number] PCP: Lucia Gaskins, MD    Patient Active Problem List   Diagnosis Date Noted  . HIV (human immunodeficiency virus infection) ([place]) 05/25/2017    Priority: High  . Prediabetes 10/26/2018  . Healthcare maintenance 04/12/2018  . Chronic pain 04/12/2018  . Elevated blood pressure reading 06/15/2017  . Hypercholesteremia 05/25/2017  . 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 male with HIV infection, non-AIDS; Dx 2005 with CD4 nadir unknown (never on OI proph). HLA B*5701 (+).  HIV Risk: heterosexual.  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   Previous Regimens:  Reyataz / Noravir / Truvada --> suppressed  05/2017: Descovy / Prezcobix --> suppressed (switched to reduce hyperglycemia a/w cobi)  Biktarvy 2018 --> suppressed   Genotypes:  Records not sent after 2 requests. Undetectable recently.    Subjective:  CC:  Bernis Schaaf is a here today for follow up on HIV care.   HPI: [name] is doing very well. He has continued his Biktarvy every day without any missed dose. He has no concern over side effects or access to his medications. Reports no complaints today suggestive of associated opportunistic infection or advancing HIV disease such as fevers, night sweats, weight loss, anorexia, cough, SOB, nausea, vomiting, diarrhea, headache, sensory changes, lymphadenopathy or oral thrush.  No concerns over anxious or depressed mood today.  He is eating and sleeping normally.  He is in a monogamous relationship with his wife and she is currently [redacted] weeks pregnant and due in May.  They are happy to have their first little girl that they have been waiting for.  No significant illnesses or hospitalizations/ER visits since her last office visit.  He has had a few changes to his blood pressure medications  which we updated in his chart today.  He is following with his primary care provider for management.   Review of Systems  Constitutional: Negative for appetite change, chills, fatigue, fever and unexpected weight change.  Eyes: Negative for visual disturbance.  Respiratory: Negative for cough and shortness of breath.   Cardiovascular: Negative for chest pain and leg swelling.  Gastrointestinal: Negative for abdominal pain, diarrhea and nausea.  Genitourinary: Negative for discharge, dysuria and genital sores.  Musculoskeletal: Positive for back pain and neck pain. Negative for joint swelling.  Skin: Negative for color change and rash.  Neurological: Negative for dizziness and headaches.  Hematological: Negative for adenopathy.  Psychiatric/Behavioral: Negative for sleep disturbance. The patient is not nervous/anxious.      Past Medical History:  Diagnosis Date  . 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 Elevated blood pressure reading 06/15/2017  . HIV (human immunodeficiency virus infection) ([place]) 05/25/2017   Dx 2005  . Hyperlipidemia     Outpatient Medications Prior to Visit  Medication Sig Dispense Refill  . gabapentin (NEURONTIN) 300 MG capsule Take 1 capsule (300 mg total) by mouth 2 (two) times daily. 60 capsule 3  . PARoxetine (PAXIL) 20 MG tablet Take 1 tablet (20 mg total) by mouth daily. 30 tablet 5  . rosuvastatin (CRESTOR) 10 MG tablet Take 1 tablet (10 mg total) by mouth daily. 30 tablet 5  . bictegravir-emtricitabine-tenofovir AF (BIKTARVY) 50-200-25 MG TABS tablet Take 1 tablet by mouth daily. 30 tablet 5  . oxyCODONE-acetaminophen (PERCOCET) 10-325 MG tablet Take 1 tablet  by mouth every 4 (four) hours as needed for pain.    Marland Kitchen amLODipine (NORVASC) 10 MG tablet Take 10 mg by mouth daily.    Marland Kitchen lisinopril (ZESTRIL) 20 MG tablet Take 20 mg by mouth daily.    Marland Kitchen oxyCODONE (OXY IR/ROXICODONE) 5 MG immediate release tablet Take 5 mg by mouth  daily.     No facility-administered medications prior to visit.      Allergies  Allergen Reactions  . Other     Sweet Peppers and arugala-- Hives and angioedema.     Social History   Tobacco Use  . Smoking status: Former Smoker    Types: Cigarettes  . Smokeless tobacco: Never Used  Substance Use Topics  . Alcohol use: No    Comment: No alcohol x 18 years.   . Drug use: No    Social History   Substance and Sexual Activity  Sexual Activity Yes  . Partners: Female  . Birth control/protection: Condom    Objective:   Vitals:   08/31/19 1430  BP: 116/79  Pulse: 80  Weight: 243 lb (110.2 kg)   There is no height or weight on file to calculate BMI.  Physical Exam Vitals signs reviewed.  Constitutional:      Appearance: He is well-developed.     Comments: Seated comfortably in chair during visit. Appears well but stressed.   HENT:     Mouth/Throat:     Mouth: Mucous membranes are moist.     Dentition: Normal dentition. No dental abscesses.     Pharynx: Oropharynx is clear. No oropharyngeal exudate.  Eyes:     General: No scleral icterus.    Pupils: Pupils are equal, round, and reactive to light.  Neck:     Musculoskeletal: No muscular tenderness.  Cardiovascular:     Rate and Rhythm: Normal rate and regular rhythm.     Heart sounds: Normal heart sounds.  Pulmonary:     Effort: Pulmonary effort is normal.     Breath sounds: Normal breath sounds.  Abdominal:     General: There is no distension.     Palpations: Abdomen is soft.     Tenderness: There is no abdominal tenderness.  Skin:    General: Skin is warm and dry.     Findings: No rash.  Neurological:     Mental Status: He is alert and oriented to person, place, and time.  Psychiatric:        Judgment: Judgment normal.     Comments: In good spirits today and engaged in care discussion.      Lab Results Lab Results  Component Value Date   WBC 4.4 07/20/2019   HGB 12.7 (L) 07/20/2019   HCT 40.3  07/20/2019   MCV 83.4 07/20/2019   PLT 301 07/20/2019    Lab Results  Component Value Date   CREATININE 1.03 07/20/2019   BUN 14 07/20/2019   NA 136 07/20/2019   K 4.4 07/20/2019   CL 101 07/20/2019   CO2 24 07/20/2019    Lab Results  Component Value Date   ALT 39 07/20/2019   AST 35 07/20/2019   ALKPHOS 97 05/18/2017   BILITOT 0.4 07/20/2019    Lab Results  Component Value Date   CHOL 215 (H) 07/20/2019   HDL 50 07/20/2019   LDLCALC 142 (H) 07/20/2019   TRIG 110 07/20/2019   CHOLHDL 4.3 07/20/2019   HIV 1 RNA Quant (copies/mL)  Date Value  07/20/2019 <20 NOT DETECTED  10/12/2018 <  20 DETECTED (A)  03/31/2018 <20 DETECTED (A)   CD4 T Cell Abs (/uL)  Date Value  07/20/2019 438  10/12/2018 360 (L)  03/31/2018 690     Assessment & Plan:   Problem List Items Addressed This Visit      High   HIV (human immunodeficiency virus infection) ([place]) (Chronic)    Well controlled HIV with VL < 20 / CD4 438. He is tolerating the [place] well, there is no concern for side effect or drug interaction. Will continue and provide refills today. His wife is pregnant and I advised she get tested again in 2nd and 3rd trimester if they continue to have unprotected sex. Explained his risk to transfer infection to her is essentially zero as long as he takes his medications strictly.  He will get flu shot today.  Return in about 4 months (around 01/01/2020). with labs prior to.       Relevant Medications   bictegravir-emtricitabine-tenofovir AF (BIKTARVY) 50-200-25 MG TABS tablet   Other Relevant Orders   HIV-1 RNA quant-no reflex-bld   RPR   T-helper cell (CD4)- ([facility] clinic only)     Unprioritized   Hypercholesteremia    May need to increase his crestor dose. PCP managing. He has pre-diabetes in addition to elevated LDL and TGs.       Relevant Medications   amLODipine (NORVASC) 10 MG tablet   lisinopril (ZESTRIL) 20 MG tablet    Other Visit Diagnoses    Need for  immunization against influenza       Relevant Orders   Flu Vaccine QUAD 36+ mos IM (Completed)      Janene Madeira, MSN, NP-C [facility] for [place] Pager: [phone] Office: [phone]  09/01/19  10:42 PM

## 2019-08-31 NOTE — Patient Instructions (Signed)
So nice to see you! Congratulations on your little girl! Can't wait to see pictures.   Please continue your Biktarvy every day.   Would like to see you back in 4 months with labs prior to your visit.

## 2019-09-01 NOTE — Assessment & Plan Note (Signed)
May need to increase his crestor dose. PCP managing. He has pre-diabetes in addition to elevated LDL and TGs.

## 2019-09-01 NOTE — Assessment & Plan Note (Signed)
Well controlled HIV with VL < 20 / CD4 438. He is tolerating the [place] well, there is no concern for side effect or drug interaction. Will continue and provide refills today. His wife is pregnant and I advised she get tested again in 2nd and 3rd trimester if they continue to have unprotected sex. Explained his risk to transfer infection to her is essentially zero as long as he takes his medications strictly.  He will get flu shot today.  Return in about 4 months (around 01/01/2020). with labs prior to.

## 2019-09-21 ENCOUNTER — Encounter: Payer: Self-pay | Admitting: Infectious Diseases

## 2019-12-15 ENCOUNTER — Other Ambulatory Visit: Payer: Medicaid Other

## 2019-12-29 ENCOUNTER — Encounter: Payer: Medicaid Other | Admitting: Infectious Diseases

## 2020-01-17 ENCOUNTER — Other Ambulatory Visit: Payer: Self-pay

## 2020-01-17 ENCOUNTER — Ambulatory Visit (INDEPENDENT_AMBULATORY_CARE_PROVIDER_SITE_OTHER): Payer: PRIVATE HEALTH INSURANCE | Admitting: Infectious Diseases

## 2020-01-17 ENCOUNTER — Encounter: Payer: Self-pay | Admitting: Infectious Diseases

## 2020-01-17 VITALS — Temp 97.7°F | Ht 66.0 in | Wt 243.0 lb

## 2020-01-17 DIAGNOSIS — I10 Essential (primary) hypertension: Secondary | ICD-10-CM | POA: Diagnosis not present

## 2020-01-17 DIAGNOSIS — E7800 Pure hypercholesterolemia, unspecified: Secondary | ICD-10-CM | POA: Diagnosis not present

## 2020-01-17 DIAGNOSIS — Z21 Asymptomatic human immunodeficiency virus [HIV] infection status: Secondary | ICD-10-CM | POA: Diagnosis not present

## 2020-01-17 NOTE — Assessment & Plan Note (Signed)
Doing well on Biktarvy - will continue this for him once daily. Will update labs today.  No drug interactions identified. Monogamous relationship with male partner, defer STI screening today.  Vaccines up to date for age/conditions.  Would benefit from COVID vaccine but not considering at this time.   Return in about 6 months (around 07/19/2020). with labs prior to his visit.

## 2020-01-17 NOTE — Patient Instructions (Addendum)
Nice to see you!  Please continue your Biktarvy once a day as you have been.  Will update your labs today to see how things are going with your medication - I suspect it is still perfect.   Please return in 6 months with labs prior to your appointment (fasting please - no food prior to lab draw for 8 hours, water only)

## 2020-01-17 NOTE — Progress Notes (Signed)
Name: Roberto Woodard  DOB: 07/29/1971 MRN: [id number] PCP: Lucia Gaskins, MD    Patient Active Problem List   Diagnosis Date Noted  . HIV (human immunodeficiency virus infection) ([place]) 05/25/2017    Priority: High  . Prediabetes 10/26/2018  . Healthcare maintenance 04/12/2018  . Chronic pain 04/12/2018  . Hypertension 06/15/2017  . Hypercholesteremia 05/25/2017  . 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 male with HIV infection, non-AIDS; Dx 2005 with CD4 nadir unknown (never on OI proph). HLA B*5701 (+).  HIV Risk: heterosexual.  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   Previous Regimens:  Reyataz / Noravir / Truvada --> suppressed  05/2017: Descovy / Prezcobix --> suppressed (switched to reduce hyperglycemia a/w cobi)  Biktarvy 2018 --> suppressed   Genotypes:  Records not sent after 2 requests. Undetectable recently.     Subjective:  CC:  Roberto Woodard is a here today for follow up on HIV care.  No complaints.    HPI: Roberto Woodard is doing well. His wife is 7 months pregnant with their son.     Review of Systems  Constitutional: Negative for appetite change, chills, fatigue, fever and unexpected weight change.  Eyes: Negative for visual disturbance.  Respiratory: Negative for cough and shortness of breath.   Cardiovascular: Negative for chest pain and leg swelling.  Gastrointestinal: Negative for abdominal pain, diarrhea and nausea.  Genitourinary: Negative for discharge, dysuria and genital sores.  Musculoskeletal: Positive for back pain and neck pain. Negative for joint swelling.  Skin: Negative for color change and rash.  Neurological: Negative for dizziness and headaches.  Hematological: Negative for adenopathy.  Psychiatric/Behavioral: Negative for sleep disturbance. The patient is not nervous/anxious.      Past Medical  History:  Diagnosis Date  . 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 Elevated blood pressure reading 06/15/2017  . HIV (human immunodeficiency virus infection) ([place]) 05/25/2017   Dx 2005  . Hyperlipidemia     Outpatient Medications Prior to Visit  Medication Sig Dispense Refill  . amLODipine (NORVASC) 10 MG tablet Take 10 mg by mouth daily.    . bictegravir-emtricitabine-tenofovir AF (BIKTARVY) 50-200-25 MG TABS tablet Take 1 tablet by mouth daily. 30 tablet 11  . gabapentin (NEURONTIN) 300 MG capsule Take 1 capsule (300 mg total) by mouth 2 (two) times daily. 60 capsule 3  . lisinopril (ZESTRIL) 20 MG tablet Take 20 mg by mouth daily.    Marland Kitchen oxyCODONE (OXY IR/ROXICODONE) 5 MG immediate release tablet Take 5 mg by mouth daily.    Marland Kitchen PARoxetine (PAXIL) 20 MG tablet Take 1 tablet (20 mg total) by mouth daily. 30 tablet 5  . rosuvastatin (CRESTOR) 10 MG tablet Take 1 tablet (10 mg total) by mouth daily. 30 tablet 5   No facility-administered medications prior to visit.     Allergies  Allergen Reactions  . Other     Sweet Peppers and arugala-- Hives and angioedema.     Social History   Tobacco Use  . Smoking status: Former Smoker    Types: Cigarettes  . Smokeless tobacco: Never Used  Substance Use Topics  . Alcohol use: No    Comment: No alcohol x 18 years.   . Drug use: No    Social History   Substance and Sexual Activity  Sexual Activity Yes  . Partners: Female  .  Birth control/protection: Condom    Objective:   Vitals:   01/17/20 1423  Temp: 97.7 F (36.5 C)  SpO2: 98%  Weight: 243 lb (110.2 kg)  Height: 5' 6"  (1.676 m)   Body mass index is 39.22 kg/m.  Physical Exam Vitals reviewed.  Constitutional:      Appearance: He is well-developed.     Comments: Seated comfortably in chair during visit. Appears well   HENT:     Mouth/Throat:     Mouth: Mucous membranes are moist.     Dentition: Normal dentition. No dental  abscesses.     Pharynx: Oropharynx is clear. No oropharyngeal exudate.  Eyes:     General: No scleral icterus.    Pupils: Pupils are equal, round, and reactive to light.  Cardiovascular:     Rate and Rhythm: Normal rate and regular rhythm.     Heart sounds: Normal heart sounds.  Pulmonary:     Effort: Pulmonary effort is normal.     Breath sounds: Normal breath sounds.  Abdominal:     General: There is no distension.     Palpations: Abdomen is soft.     Tenderness: There is no abdominal tenderness.  Musculoskeletal:     Cervical back: No muscular tenderness.  Skin:    General: Skin is warm and dry.     Findings: No rash.  Neurological:     Mental Status: He is alert and oriented to person, place, and time.  Psychiatric:        Judgment: Judgment normal.     Comments: In good spirits today and engaged in care discussion.      Lab Results Lab Results  Component Value Date   WBC 4.4 07/20/2019   HGB 12.7 (L) 07/20/2019   HCT 40.3 07/20/2019   MCV 83.4 07/20/2019   PLT 301 07/20/2019    Lab Results  Component Value Date   CREATININE 1.03 07/20/2019   BUN 14 07/20/2019   NA 136 07/20/2019   K 4.4 07/20/2019   CL 101 07/20/2019   CO2 24 07/20/2019    Lab Results  Component Value Date   ALT 39 07/20/2019   AST 35 07/20/2019   ALKPHOS 97 05/18/2017   BILITOT 0.4 07/20/2019    Lab Results  Component Value Date   CHOL 215 (H) 07/20/2019   HDL 50 07/20/2019   LDLCALC 142 (H) 07/20/2019   TRIG 110 07/20/2019   CHOLHDL 4.3 07/20/2019   HIV 1 RNA Quant (copies/mL)  Date Value  07/20/2019 <20 NOT DETECTED  10/12/2018 <20 DETECTED (A)  03/31/2018 <20 DETECTED (A)   CD4 T Cell Abs (/uL)  Date Value  07/20/2019 438  10/12/2018 360 (L)  03/31/2018 690     Assessment & Plan:   Problem List Items Addressed This Visit      High   HIV (human immunodeficiency virus infection) ([place]) - Primary (Chronic)    Doing well on Biktarvy - will continue this for him once  daily. Will update labs today.  No drug interactions identified. Monogamous relationship with male partner, defer STI screening today.  Vaccines up to date for age/conditions.  Would benefit from COVID vaccine but not considering at this time.   Return in about 6 months (around 07/19/2020). with labs prior to his visit.       Relevant Orders   HIV-1 RNA quant-no reflex-bld   T-helper cell (CD4)- ([facility] clinic only)   HIV-1 RNA quant-no reflex-bld   T-helper cell (CD4)- ([facility]  clinic only)   COMPLETE METABOLIC PANEL WITH GFR   CBC with Differential/Platelet   RPR   Lipid panel     Unprioritized   Hypertension    BP Readings from Last 3 Encounters:  08/31/19 116/79  01/20/19 119/76  10/26/18 116/78   Well controlled on lisinopril + HCTZ. No changes needed.       Hypercholesteremia    Check fasting blood work next visit to assess statin dose.          Janene Madeira, MSN, NP-C [facility] for Infectious [place] Pager: [phone] Office: [phone]  01/17/20

## 2020-01-17 NOTE — Assessment & Plan Note (Signed)
BP Readings from Last 3 Encounters:  08/31/19 116/79  01/20/19 119/76  10/26/18 116/78   Well controlled on lisinopril + HCTZ. No changes needed.

## 2020-01-17 NOTE — Assessment & Plan Note (Signed)
Check fasting blood work next visit to assess statin dose.

## 2020-01-18 ENCOUNTER — Encounter: Payer: Self-pay | Admitting: Infectious Diseases

## 2020-01-18 LAB — T-HELPER CELL (CD4) - (RCID CLINIC ONLY)
CD4 % Helper T Cell: 32 % — ABNORMAL LOW (ref 33–65)
CD4 T Cell Abs: 771 /uL (ref 400–1790)

## 2020-01-21 LAB — HIV-1 RNA QUANT-NO REFLEX-BLD
HIV 1 RNA Quant: <20 copies/mL — AB
HIV-1 RNA Quant, Log: <1.3 Log copies/mL — AB

## 2020-07-11 ENCOUNTER — Other Ambulatory Visit: Payer: Self-pay

## 2020-07-11 ENCOUNTER — Ambulatory Visit: Payer: PRIVATE HEALTH INSURANCE

## 2020-07-11 ENCOUNTER — Other Ambulatory Visit: Payer: PRIVATE HEALTH INSURANCE

## 2020-07-11 DIAGNOSIS — B20 Human immunodeficiency virus [HIV] disease: Secondary | ICD-10-CM

## 2020-07-11 DIAGNOSIS — Z21 Asymptomatic human immunodeficiency virus [HIV] infection status: Secondary | ICD-10-CM

## 2020-07-12 LAB — T-HELPER CELL (CD4) - (RCID CLINIC ONLY)
CD4 % Helper T Cell: 34 % (ref 33–65)
CD4 T Cell Abs: 517 /uL (ref 400–1790)

## 2020-07-14 LAB — CBC WITH DIFFERENTIAL/PLATELET
Absolute Monocytes: 464 cells/uL (ref 200–950)
Basophils Absolute: 51 cells/uL (ref 0–200)
Basophils Relative: 1 %
Eosinophils Absolute: 122 cells/uL (ref 15–500)
Eosinophils Relative: 2.4 %
HCT: 39.3 % (ref 38.5–50.0)
Hemoglobin: 12.9 g/dL — ABNORMAL LOW (ref 13.2–17.1)
Lymphs Abs: 1607 cells/uL (ref 850–3900)
MCH: 28 pg (ref 27.0–33.0)
MCHC: 32.8 g/dL (ref 32.0–36.0)
MCV: 85.2 fL (ref 80.0–100.0)
MPV: 11.3 fL (ref 7.5–12.5)
Monocytes Relative: 9.1 %
Neutro Abs: 2856 cells/uL (ref 1500–7800)
Neutrophils Relative %: 56 %
Platelets: 288 10*3/uL (ref 140–400)
RBC: 4.61 10*6/uL (ref 4.20–5.80)
RDW: 14.2 % (ref 11.0–15.0)
Total Lymphocyte: 31.5 %
WBC: 5.1 10*3/uL (ref 3.8–10.8)

## 2020-07-14 LAB — COMPLETE METABOLIC PANEL WITH GFR
AG Ratio: 1.4 (calc) (ref 1.0–2.5)
ALT: 22 U/L (ref 9–46)
AST: 24 U/L (ref 10–40)
Albumin: 4.3 g/dL (ref 3.6–5.1)
Alkaline phosphatase (APISO): 64 U/L (ref 36–130)
BUN: 17 mg/dL (ref 7–25)
CO2: 27 mmol/L (ref 20–32)
Calcium: 9.5 mg/dL (ref 8.6–10.3)
Chloride: 101 mmol/L (ref 98–110)
Creat: 0.94 mg/dL (ref 0.60–1.35)
GFR, Est African American: 112 mL/min/{1.73_m2} (ref >=60)
GFR, Est Non African American: 97 mL/min/{1.73_m2} (ref >=60)
Globulin: 3.1 g/dL (calc) (ref 1.9–3.7)
Glucose, Bld: 102 mg/dL — ABNORMAL HIGH (ref 65–99)
Potassium: 4.2 mmol/L (ref 3.5–5.3)
Sodium: 136 mmol/L (ref 135–146)
Total Bilirubin: 0.4 mg/dL (ref 0.2–1.2)
Total Protein: 7.4 g/dL (ref 6.1–8.1)

## 2020-07-14 LAB — LIPID PANEL
Cholesterol: 206 mg/dL — ABNORMAL HIGH (ref <200)
HDL: 50 mg/dL (ref >=40)
LDL Cholesterol (Calc): 135 mg/dL (calc) — ABNORMAL HIGH
Non-HDL Cholesterol (Calc): 156 mg/dL (calc) — ABNORMAL HIGH (ref <130)
Total CHOL/HDL Ratio: 4.1 (calc) (ref <5.0)
Triglycerides: 107 mg/dL (ref <150)

## 2020-07-14 LAB — HIV-1 RNA QUANT-NO REFLEX-BLD
HIV 1 RNA Quant: <20 Copies/mL
HIV-1 RNA Quant, Log: <1.3 Log cps/mL

## 2020-07-14 LAB — RPR: RPR Ser Ql: NONREACTIVE

## 2020-07-31 ENCOUNTER — Ambulatory Visit: Payer: PRIVATE HEALTH INSURANCE | Admitting: Infectious Diseases

## 2020-08-03 ENCOUNTER — Encounter: Payer: Self-pay | Admitting: Infectious Diseases

## 2020-08-29 ENCOUNTER — Other Ambulatory Visit: Payer: Self-pay | Admitting: Infectious Diseases

## 2020-08-29 DIAGNOSIS — B20 Human immunodeficiency virus [HIV] disease: Secondary | ICD-10-CM

## 2020-09-04 ENCOUNTER — Other Ambulatory Visit: Payer: Self-pay

## 2020-09-04 ENCOUNTER — Encounter: Payer: Self-pay | Admitting: Infectious Diseases

## 2020-09-04 ENCOUNTER — Ambulatory Visit (INDEPENDENT_AMBULATORY_CARE_PROVIDER_SITE_OTHER): Payer: PRIVATE HEALTH INSURANCE

## 2020-09-04 ENCOUNTER — Ambulatory Visit (INDEPENDENT_AMBULATORY_CARE_PROVIDER_SITE_OTHER): Payer: PRIVATE HEALTH INSURANCE | Admitting: Infectious Diseases

## 2020-09-04 VITALS — BP 122/86 | HR 67 | Temp 98.0°F | Ht 66.0 in | Wt 224.0 lb

## 2020-09-04 DIAGNOSIS — Z23 Encounter for immunization: Secondary | ICD-10-CM

## 2020-09-04 DIAGNOSIS — R7303 Prediabetes: Secondary | ICD-10-CM | POA: Diagnosis not present

## 2020-09-04 DIAGNOSIS — Z21 Asymptomatic human immunodeficiency virus [HIV] infection status: Secondary | ICD-10-CM | POA: Diagnosis not present

## 2020-09-04 DIAGNOSIS — Z7185 Encounter for immunization safety counseling: Secondary | ICD-10-CM | POA: Diagnosis not present

## 2020-09-04 NOTE — Assessment & Plan Note (Signed)
Fasting glucose on labs significantly improved with 20# weight loss.  Will check A1c next lab draw.

## 2020-09-04 NOTE — Assessment & Plan Note (Addendum)
Doing well on Biktarvy perfectly suppressed. Healthy CD4 count.  Working with PCP locally.  Lipids look pretty good off statin therapy. Would hold off for now given his desire for more weight loss.  Flu and Pfizer #1 today.  RTC in 30m with labs prior to.

## 2020-09-04 NOTE — Progress Notes (Signed)
Name: Roberto Woodard  DOB: Jan 25, 1971 MRN: [id number] PCP: Lucia Gaskins, MD    Patient Active Problem List   Diagnosis Date Noted  . HIV (human immunodeficiency virus infection) ([place]) 05/25/2017    Priority: High  . Prediabetes 10/26/2018  . Vaccine counseling 04/12/2018  . Chronic pain 04/12/2018  . Hypertension 06/15/2017  . Hypercholesteremia 05/25/2017  . 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 male with HIV infection, non-AIDS; Dx 2005 with CD4 nadir unknown (never on OI proph). HLA B*5701 (+).  HIV Risk: heterosexual.  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   Previous Regimens:  Reyataz / Noravir / Truvada --> suppressed  05/2017: Descovy / Prezcobix --> suppressed (switched to reduce hyperglycemia a/w cobi)  Biktarvy 2018 --> suppressed   Genotypes:  Records not sent     Subjective:  CC:  Roberto Woodard is a here today for follow up on HIV care.  No complaints.    HPI: Kilo is doing well. His son is 66 months old. 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 Eating and sleeping well. Has lost about 20 lbs with increased work and being busier. Stopped his statin medication and not taking any other medications regularly aside from pain meds. He does use marijuana about 2-3 times a week for adjunctive pain control given significant trouble with constipation on Oxycodone.  He is interested in medical marijuana as this helps significantly without the risk of constipation.   Taking Biktarvy every day without missed doses. No changes in medical history, no hospitalizations.     Review of Systems  Constitutional: Negative for appetite change, chills, fatigue, fever and unexpected weight change.  Eyes: Negative for visual disturbance.  Respiratory: Negative for cough and shortness of breath.   Cardiovascular:  Negative for chest pain and leg swelling.  Gastrointestinal: Negative for abdominal pain, diarrhea and nausea.  Genitourinary: Negative for discharge, dysuria and genital sores.  Musculoskeletal: Positive for back pain and neck pain. Negative for joint swelling.  Skin: Negative for color change and rash.  Neurological: Negative for dizziness and headaches.  Hematological: Negative for adenopathy.  Psychiatric/Behavioral: Negative for sleep disturbance. The patient is not nervous/anxious.      Past Medical History:  Diagnosis Date  . 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 Elevated blood pressure reading 06/15/2017  . HIV (human immunodeficiency virus infection) ([place]) 05/25/2017   Dx 2005  . Hyperlipidemia     Outpatient Medications Prior to Visit  Medication Sig Dispense Refill  . amLODipine (NORVASC) 10 MG tablet Take 10 mg by mouth daily.    Marland Kitchen BIKTARVY 50-200-25 MG TABS tablet TAKE 1 TABLET BY MOUTH DAILY 30 tablet 0  . gabapentin (NEURONTIN) 300 MG capsule Take 1 capsule (300 mg total) by mouth 2 (two) times daily. 60 capsule 3  . lisinopril (ZESTRIL) 20 MG tablet Take 20 mg by mouth daily.    Marland Kitchen oxyCODONE (OXY IR/ROXICODONE) 5 MG immediate release tablet Take 5 mg by mouth daily.    Marland Kitchen PARoxetine (PAXIL) 20 MG tablet Take 1 tablet (20 mg total) by mouth daily. 30 tablet 5  . rosuvastatin (CRESTOR) 10 MG tablet Take 1 tablet (10 mg total) by mouth daily. 30 tablet 5  . metFORMIN (GLUCOPHAGE) 500 MG tablet Take 500 mg by mouth 2 (two) times daily. (Patient not taking: Reported on  09/04/2020)     No facility-administered medications prior to visit.     Allergies  Allergen Reactions  . Other     Sweet Peppers and arugala-- Hives and angioedema.     Social History   Tobacco Use  . Smoking status: Former Smoker    Types: Cigarettes  . Smokeless tobacco: Never Used  Substance Use Topics  . Alcohol use: Yes    Comment: wine occasionally   .  Drug use: Yes    Types: Marijuana    Comment: twice a day     Social History   Substance and Sexual Activity  Sexual Activity Yes  . Partners: Female  . Birth control/protection: Condom    Objective:   Vitals:   09/04/20 0909  BP: 122/86  Pulse: 67  Temp: 98 F (36.7 C)  TempSrc: Oral  SpO2: 99%  Weight: 224 lb (101.6 kg)  Height: 5' 6"  (1.676 m)   Body mass index is 36.15 kg/m.  Physical Exam Constitutional:      Appearance: Normal appearance. He is not ill-appearing.  HENT:     Head: Normocephalic.     Mouth/Throat:     Mouth: Mucous membranes are moist.     Pharynx: Oropharynx is clear.  Eyes:     General: No scleral icterus. Pulmonary:     Effort: Pulmonary effort is normal.  Musculoskeletal:        General: Normal range of motion.     Cervical back: Normal range of motion.  Skin:    Coloration: Skin is not jaundiced or pale.  Neurological:     Mental Status: He is alert and oriented to person, place, and time.  Psychiatric:        Mood and Affect: Mood normal.        Judgment: Judgment normal.     Lab Results Lab Results  Component Value Date   WBC 5.1 07/11/2020   HGB 12.9 (L) 07/11/2020   HCT 39.3 07/11/2020   MCV 85.2 07/11/2020   PLT 288 07/11/2020    Lab Results  Component Value Date   CREATININE 0.94 07/11/2020   BUN 17 07/11/2020   NA 136 07/11/2020   K 4.2 07/11/2020   CL 101 07/11/2020   CO2 27 07/11/2020    Lab Results  Component Value Date   ALT 22 07/11/2020   AST 24 07/11/2020   ALKPHOS 97 05/18/2017   BILITOT 0.4 07/11/2020    Lab Results  Component Value Date   CHOL 206 (H) 07/11/2020   HDL 50 07/11/2020   LDLCALC 135 (H) 07/11/2020   TRIG 107 07/11/2020   CHOLHDL 4.1 07/11/2020   HIV 1 RNA Quant  Date Value  07/11/2020 <20 Copies/mL  01/17/2020 <20 DETECTED copies/mL (A)  07/20/2019 <20 NOT DETECTED copies/mL   CD4 T Cell Abs (/uL)  Date Value  07/11/2020 517  01/17/2020 771  07/20/2019 438      Assessment & Plan:   Problem List Items Addressed This Visit      High   HIV (human immunodeficiency virus infection) ([place]) (Chronic)    Doing well on Biktarvy perfectly suppressed. Healthy CD4 count.  Working with PCP locally.  Lipids look pretty good off statin therapy. Would hold off for now given his desire for more weight loss.  Flu and [place] #1 today.  RTC in 78mwith labs prior to.          Unprioritized   Vaccine counseling    Counseled regarding COVID  vaccine options - will give [place] #1 today. He may want to bring his wife in for series also if he does OK.  Flu shot given today also.       Prediabetes    Fasting glucose on labs significantly improved with 20# weight loss.  Will check A1c next lab draw.       Relevant Orders   Hemoglobin A1c    Other Visit Diagnoses    Asymptomatic HIV infection ([place])    -  Primary   Relevant Orders   HIV-1 RNA quant-no reflex-bld   T-helper cell (CD4)- ([facility] clinic only)      Janene Madeira, MSN, NP-C [facility] for [place] Pager: [phone] Office: [phone]  09/04/20

## 2020-09-04 NOTE — Progress Notes (Signed)
   Covid-19 Vaccination Clinic  Name:  Roberto Woodard    MRN: [id number] DOB: December 14, 1970  09/04/2020  [name] was observed post Covid-19 immunization for 15 minutes without incident. He was provided with Vaccine Information Sheet and instruction to access the V-Safe system.   [name] was instructed to call 911 with any severe reactions post vaccine: Marland Kitchen Difficulty breathing  . Swelling of face and throat  . A fast heartbeat  . A bad rash all over body  . Dizziness and weakness     Sandie Ano, RN

## 2020-09-04 NOTE — Assessment & Plan Note (Signed)
Counseled regarding COVID vaccine options - will give Pfizer #1 today. He may want to bring his wife in for series also if he does OK.  Flu shot given today also.

## 2020-09-04 NOTE — Patient Instructions (Addendum)
Continue   Stool softeners for the Colace start this 1-2 times a day.   You have lost 20 lbs since our last visit. This is great for your overall health and reducing the risk of you going on to develop diabetes later.   I can look into some of the Medical Marijuana requirements for [place] but I am not familiar with the process but I want you to discuss with Dr. Janna Arch more since he helps with your other pain medications. I don't want anything to jeopardize that.   Plan to come back in 6 months with labs prior to your visit.      Prediabetes Eating Plan Prediabetes is a condition that causes blood sugar (glucose) levels to be higher than normal. This increases the risk for developing diabetes. In order to prevent diabetes from developing, your health care provider may recommend a diet and other lifestyle changes to help you:  Control your blood glucose levels.  Improve your cholesterol levels.  Manage your blood pressure. Your health care provider may recommend working with a diet and nutrition specialist (dietitian) to make a meal plan that is best for you. What are tips for following this plan? Lifestyle  Set weight loss goals with the help of your health care team. It is recommended that most people with prediabetes lose 7% of their current body weight.  Exercise for at least 30 minutes at least 5 days a week.  Attend a support group or seek ongoing support from a mental health counselor.  Take over-the-counter and prescription medicines only as told by your health care provider. Reading food labels  Read food labels to check the amount of fat, salt (sodium), and sugar in prepackaged foods. Avoid foods that have: ? Saturated fats. ? Trans fats. ? Added sugars.  Avoid foods that have more than 300 milligrams (mg) of sodium per serving. Limit your daily sodium intake to less than 2,300 mg each day. Shopping  Avoid buying pre-made and processed foods. Cooking  Cook with  olive oil. Do not use butter, lard, or ghee.  Bake, broil, grill, or boil foods. Avoid frying. Meal planning   Work with your dietitian to develop an eating plan that is right for you. This may include: ? Tracking how many calories you take in. Use a food diary, notebook, or mobile application to track what you eat at each meal. ? Using the glycemic index (GI) to plan your meals. The index tells you how quickly a food will raise your blood glucose. Choose low-GI foods. These foods take a longer time to raise blood glucose.  Consider following a Mediterranean diet. This diet includes: ? Several servings each day of fresh fruits and vegetables. ? Eating fish at least twice a week. ? Several servings each day of whole grains, beans, nuts, and seeds. ? Using olive oil instead of other fats. ? Moderate alcohol consumption. ? Eating small amounts of red meat and whole-fat dairy.  If you have high blood pressure, you may need to limit your sodium intake or follow a diet such as the DASH eating plan. DASH is an eating plan that aims to lower high blood pressure. What foods are recommended? The items listed below may not be a complete list. Talk with your dietitian about what dietary choices are best for you. Grains Whole grains, such as whole-wheat or whole-grain breads, crackers, cereals, and pasta. Unsweetened oatmeal. Bulgur. Barley. Quinoa. Brown rice. Corn or whole-wheat flour tortillas or taco shells. Vegetables Lettuce.  Spinach. Peas. Beets. Cauliflower. Cabbage. Broccoli. Carrots. Tomatoes. Squash. Eggplant. Herbs. Peppers. Onions. Cucumbers. Brussels sprouts. Fruits Berries. Bananas. Apples. Oranges. Grapes. Papaya. Mango. Pomegranate. Kiwi. Grapefruit. Cherries. Meats and other protein foods Seafood. Poultry without skin. Lean cuts of pork and beef. Tofu. Eggs. Nuts. Beans. Dairy Low-fat or fat-free dairy products, such as yogurt, cottage cheese, and cheese. Beverages Water. Tea.  Coffee. Sugar-free or diet soda. Seltzer water. Lowfat or no-fat milk. Milk alternatives, such as soy or almond milk. Fats and oils Olive oil. Canola oil. Sunflower oil. Grapeseed oil. Avocado. Walnuts. Sweets and desserts Sugar-free or low-fat pudding. Sugar-free or low-fat ice cream and other frozen treats. Seasoning and other foods Herbs. Sodium-free spices. Mustard. Relish. Low-fat, low-sugar ketchup. Low-fat, low-sugar barbecue sauce. Low-fat or fat-free mayonnaise. What foods are not recommended? The items listed below may not be a complete list. Talk with your dietitian about what dietary choices are best for you. Grains Refined white flour and flour products, such as bread, pasta, snack foods, and cereals. Vegetables Canned vegetables. Frozen vegetables with butter or cream sauce. Fruits Fruits canned with syrup. Meats and other protein foods Fatty cuts of meat. Poultry with skin. Breaded or fried meat. Processed meats. Dairy Full-fat yogurt, cheese, or milk. Beverages Sweetened drinks, such as sweet iced tea and soda. Fats and oils Butter. Lard. Ghee. Sweets and desserts Baked goods, such as cake, cupcakes, pastries, cookies, and cheesecake. Seasoning and other foods Spice mixes with added salt. Ketchup. Barbecue sauce. Mayonnaise. Summary  To prevent diabetes from developing, you may need to make diet and other lifestyle changes to help control blood sugar, improve cholesterol levels, and manage your blood pressure.  Set weight loss goals with the help of your health care team. It is recommended that most people with prediabetes lose 7 percent of their current body weight.  Consider following a Mediterranean diet that includes plenty of fresh fruits and vegetables, whole grains, beans, nuts, seeds, fish, lean meat, low-fat dairy, and healthy oils. This information is not intended to replace advice given to you by your health care provider. Make sure you discuss any  questions you have with your health care provider. Document Revised: 02/18/2019 Document Reviewed: 12/31/2016 Elsevier Patient Education  2020 [employer].

## 2020-09-28 ENCOUNTER — Ambulatory Visit (INDEPENDENT_AMBULATORY_CARE_PROVIDER_SITE_OTHER): Payer: PRIVATE HEALTH INSURANCE

## 2020-09-28 ENCOUNTER — Other Ambulatory Visit: Payer: Self-pay

## 2020-09-28 DIAGNOSIS — Z23 Encounter for immunization: Secondary | ICD-10-CM

## 2020-09-28 NOTE — Progress Notes (Signed)
   Covid-19 Vaccination Clinic  Name:  Roberto Woodard    MRN: [id number] DOB: 1971/04/06  09/28/2020  [name] was observed post Covid-19 immunization for 15 minutes without incident. He was provided with Vaccine Information Sheet and instruction to access the V-Safe system.   [name] was instructed to call 911 with any severe reactions post vaccine: Marland Kitchen Difficulty breathing  . Swelling of face and throat  . A fast heartbeat  . A bad rash all over body  . Dizziness and weakness   Immunizations Administered    Name Date Dose VIS Date Route   Pfizer COVID-19 Vaccine 09/28/2020 11:14 AM 0.3 mL 08/29/2020 Intramuscular   Manufacturer: [employer], [employer]   Lot: [id number]   NDC: [address]-9     Andree Coss, RN

## 2020-09-29 ENCOUNTER — Other Ambulatory Visit: Payer: Self-pay | Admitting: Infectious Diseases

## 2020-09-29 DIAGNOSIS — B20 Human immunodeficiency virus [HIV] disease: Secondary | ICD-10-CM

## 2021-01-02 ENCOUNTER — Encounter: Payer: Self-pay | Admitting: Infectious Diseases

## 2021-03-05 ENCOUNTER — Other Ambulatory Visit: Payer: PRIVATE HEALTH INSURANCE

## 2021-03-19 ENCOUNTER — Encounter: Payer: PRIVATE HEALTH INSURANCE | Admitting: Infectious Diseases

## 2021-04-17 ENCOUNTER — Other Ambulatory Visit: Payer: Self-pay | Admitting: Infectious Diseases

## 2021-04-17 DIAGNOSIS — B20 Human immunodeficiency virus [HIV] disease: Secondary | ICD-10-CM

## 2021-05-19 ENCOUNTER — Other Ambulatory Visit: Payer: Self-pay | Admitting: Infectious Diseases

## 2021-05-19 DIAGNOSIS — B20 Human immunodeficiency virus [HIV] disease: Secondary | ICD-10-CM

## 2021-06-20 ENCOUNTER — Telehealth: Payer: Self-pay

## 2021-06-20 ENCOUNTER — Ambulatory Visit: Payer: Medicaid Other

## 2021-06-20 ENCOUNTER — Other Ambulatory Visit: Payer: Self-pay

## 2021-06-20 ENCOUNTER — Other Ambulatory Visit: Payer: Self-pay | Admitting: Infectious Diseases

## 2021-06-20 ENCOUNTER — Other Ambulatory Visit: Payer: Medicaid Other

## 2021-06-20 DIAGNOSIS — G8929 Other chronic pain: Secondary | ICD-10-CM

## 2021-06-20 DIAGNOSIS — R7303 Prediabetes: Secondary | ICD-10-CM

## 2021-06-20 DIAGNOSIS — B20 Human immunodeficiency virus [HIV] disease: Secondary | ICD-10-CM

## 2021-06-20 DIAGNOSIS — Z21 Asymptomatic human immunodeficiency virus [HIV] infection status: Secondary | ICD-10-CM

## 2021-06-20 NOTE — Telephone Encounter (Signed)
Patient is asking if you could refill his paxil and amlodipine. Patient states he pcp has retired and he is in the process of finding a new pcp.

## 2021-06-21 LAB — T-HELPER CELL (CD4) - (RCID CLINIC ONLY)
CD4 % Helper T Cell: 32 % — ABNORMAL LOW (ref 33–65)
CD4 T Cell Abs: 471 /uL (ref 400–1790)

## 2021-06-21 MED ORDER — PAROXETINE HCL 20 MG PO TABS: 20.0000 mg | ORAL_TABLET | Freq: Every day | ORAL | 1 refills | Status: DC

## 2021-06-21 MED ORDER — AMLODIPINE BESYLATE 10 MG PO TABS: 10.0000 mg | ORAL_TABLET | Freq: Every day | ORAL | 1 refills | Status: DC

## 2021-06-21 NOTE — Addendum Note (Signed)
Addended by: Blanchard Kelch on: 06/21/2021 09:07 AM   Modules accepted: Orders

## 2021-06-21 NOTE — Telephone Encounter (Signed)
Yes - I sent the rx to the [facility] in [place] to be mailed to him since that's where we send his biktarvy

## 2021-06-21 NOTE — Telephone Encounter (Signed)
Patient advised medication has been sent to the pharmacy. Patient requested for refills on his oxycodone as well and patient was advised that our office does not do pain medication. Patient verbalized understanding

## 2021-06-24 LAB — HEMOGLOBIN A1C
Hgb A1c MFr Bld: 5.7 % of total Hgb — ABNORMAL HIGH (ref <5.7)
Mean Plasma Glucose: 117 mg/dL
eAG (mmol/L): 6.5 mmol/L

## 2021-06-24 LAB — HIV-1 RNA QUANT-NO REFLEX-BLD
HIV 1 RNA Quant: NOT DETECTED Copies/mL
HIV-1 RNA Quant, Log: NOT DETECTED Log cps/mL

## 2021-07-05 ENCOUNTER — Other Ambulatory Visit: Payer: Self-pay

## 2021-07-05 ENCOUNTER — Ambulatory Visit (INDEPENDENT_AMBULATORY_CARE_PROVIDER_SITE_OTHER): Payer: Medicaid Other | Admitting: Infectious Diseases

## 2021-07-05 ENCOUNTER — Encounter: Payer: Self-pay | Admitting: Infectious Diseases

## 2021-07-05 VITALS — BP 135/98 | HR 102 | Temp 98.2°F | Wt 219.0 lb

## 2021-07-05 DIAGNOSIS — E7800 Pure hypercholesterolemia, unspecified: Secondary | ICD-10-CM | POA: Diagnosis not present

## 2021-07-05 DIAGNOSIS — G8929 Other chronic pain: Secondary | ICD-10-CM | POA: Diagnosis not present

## 2021-07-05 DIAGNOSIS — E119 Type 2 diabetes mellitus without complications: Secondary | ICD-10-CM | POA: Diagnosis not present

## 2021-07-05 DIAGNOSIS — B20 Human immunodeficiency virus [HIV] disease: Secondary | ICD-10-CM | POA: Diagnosis not present

## 2021-07-05 DIAGNOSIS — T7840XA Allergy, unspecified, initial encounter: Secondary | ICD-10-CM | POA: Diagnosis not present

## 2021-07-05 MED ORDER — BIKTARVY 50-200-25 MG PO TABS: 1.0000 | ORAL_TABLET | Freq: Every day | ORAL | 11 refills | Status: DC

## 2021-07-05 NOTE — Assessment & Plan Note (Signed)
Doing well on once daily Biktarvy with undetectable viral load and normal CD4 count. Refills provided today. Routine STI screening negative previously and not done today with monogamous relationship.  Flu shot advised in Sept/Oct.  Up to date on COVID vaccine.  RTC in 6-8 months

## 2021-07-05 NOTE — Assessment & Plan Note (Signed)
Referral placed to pain management today. Prefers [place] location vs [place] for Oxycodone management.

## 2021-07-05 NOTE — Assessment & Plan Note (Signed)
Concerning story for hypersensitivity reaction to metformin - advised to stop this today and monitor for further symptoms. If he still has allergic symptoms will refer to allergy and asthma for consideration of food allergy work up.

## 2021-07-05 NOTE — Patient Instructions (Addendum)
STOP the metformin (diabetes medication). If you still have allergic symptoms after you stop this I worry it may be due to a food allergy. We will arrange to have you seen with Allergy Specialist.   Will place a referral to Pain Management   Will place a referral to help you get a Primary Care Provider.   Please continue taking your Biktarvy every. I have sent in refills.   Will see you back in 6 - 8 months with labs prior to your visit if you can.

## 2021-07-05 NOTE — Progress Notes (Signed)
Name: Roberto Woodard  DOB: October 06, 1971 MRN: [id number] PCP: Lucia Gaskins, MD     Brief Narrative:  Roberto Woodard is a 50 y.o. male with HIV infection, non-AIDS; Dx 2005 with CD4 nadir unknown (never on OI proph). HLA B*5701 (+).  HIV Risk: heterosexual.  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   Previous Regimens: Reyataz / Noravir / Truvada --> suppressed 05/2017: Descovy / Prezcobix --> suppressed (switched to reduce hyperglycemia a/w cobi) Biktarvy 2018 --> suppressed    Genotypes: Records not sent      Subjective:  CC:  HIV follow up care.     HPI: Doing well.  Concern over allergic reaction to metformin or something he ate.  As soon as he takes the pill he starts to get very itchy and watery eyes. This happened a few times and eased off with benadryl. He has itching and welts on the arms and swelling of the arms and hands.  Stopped taking the metformin for now until he talked to me  Needs referral to new PCP and Pain Management . 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 Gabapentin as well.   VL < 20 and CD4 471.    Review of Systems  Constitutional:  Negative for appetite change, chills, fatigue, fever and unexpected weight change.  Eyes:  Negative for visual disturbance.  Respiratory:  Negative for cough and shortness of breath.   Cardiovascular:  Negative for chest pain and leg swelling.  Gastrointestinal:  Negative for abdominal pain, diarrhea and nausea.  Genitourinary:  Negative for dysuria, genital sores and penile discharge.  Musculoskeletal:  Positive for back pain and neck pain. Negative for joint swelling.  Skin:  Negative for color change and rash.  Neurological:  Negative for dizziness and headaches.  Hematological:  Negative for adenopathy.  Psychiatric/Behavioral:  Negative for sleep disturbance. The patient is not nervous/anxious.     Past Medical  History:  Diagnosis Date   (QFT) QuantiFERON-TB test reaction without active tuberculosis 05/25/2017   Elevated blood pressure reading 06/15/2017   HIV (human immunodeficiency virus infection) ([place]) 05/25/2017   Dx 2005   Hyperlipidemia     Outpatient Medications Prior to Visit  Medication Sig Dispense Refill   amLODipine (NORVASC) 10 MG tablet Take 1 tablet (10 mg total) by mouth daily. 30 tablet 1   gabapentin (NEURONTIN) 300 MG capsule Take 1 capsule (300 mg total) by mouth 2 (two) times daily. 60 capsule 3   oxyCODONE (OXY IR/ROXICODONE) 5 MG immediate release tablet Take 5 mg by mouth daily.     PARoxetine (PAXIL) 20 MG tablet Take 1 tablet (20 mg total) by mouth daily. 30 tablet 1   rosuvastatin (CRESTOR) 10 MG tablet Take 1 tablet (10 mg total) by mouth daily. 30 tablet 5   BIKTARVY 50-200-25 MG TABS tablet TAKE 1 TABLET BY MOUTH DAILY 30 tablet 0   metFORMIN (GLUCOPHAGE) 500 MG tablet Take 500 mg by mouth 2 (two) times daily. (Patient not taking: No sig reported)     lisinopril (ZESTRIL) 20 MG tablet Take 20 mg by mouth daily. (Patient not taking: Reported on 07/05/2021)     meloxicam (MOBIC) 15 MG tablet Take 15 mg by mouth daily. (Patient not taking: Reported on 07/05/2021)     No facility-administered medications prior to visit.     Allergies  Allergen Reactions   Other     Sweet Peppers and arugala--  Hives and angioedema.     Social History   Tobacco Use   Smoking status: Former    Types: Cigarettes   Smokeless tobacco: Never  Substance Use Topics   Alcohol use: Yes    Comment: wine occasionally    Drug use: Yes    Types: Marijuana    Comment: twice a day     Social History   Substance and Sexual Activity  Sexual Activity Yes   Partners: Female   Birth control/protection: Condom    Objective:   Vitals:   07/05/21 1030  BP: (!) 135/98  Pulse: (!) 102  Temp: 98.2 F (36.8 C)  Weight: 219 lb (99.3 kg)   Body mass index is 35.35 kg/m.  Physical  Exam Constitutional:      Appearance: Normal appearance. He is not ill-appearing.  HENT:     Head: Normocephalic.     Mouth/Throat:     Mouth: Mucous membranes are moist.     Pharynx: Oropharynx is clear.  Eyes:     General: No scleral icterus. Pulmonary:     Effort: Pulmonary effort is normal.  Musculoskeletal:        General: Normal range of motion.     Cervical back: Normal range of motion.  Skin:    Coloration: Skin is not jaundiced or pale.  Neurological:     Mental Status: He is alert and oriented to person, place, and time.  Psychiatric:        Mood and Affect: Mood normal.        Judgment: Judgment normal.    Lab Results Lab Results  Component Value Date   WBC 5.1 07/11/2020   HGB 12.9 (L) 07/11/2020   HCT 39.3 07/11/2020   MCV 85.2 07/11/2020   PLT 288 07/11/2020    Lab Results  Component Value Date   CREATININE 0.94 07/11/2020   BUN 17 07/11/2020   NA 136 07/11/2020   K 4.2 07/11/2020   CL 101 07/11/2020   CO2 27 07/11/2020    Lab Results  Component Value Date   ALT 22 07/11/2020   AST 24 07/11/2020   ALKPHOS 97 05/18/2017   BILITOT 0.4 07/11/2020    Lab Results  Component Value Date   CHOL 206 (H) 07/11/2020   HDL 50 07/11/2020   LDLCALC 135 (H) 07/11/2020   TRIG 107 07/11/2020   CHOLHDL 4.1 07/11/2020   HIV 1 RNA Quant  Date Value  06/20/2021 Not Detected Copies/mL  07/11/2020 <20 Copies/mL  01/17/2020 <20 DETECTED copies/mL (A)   CD4 T Cell Abs (/uL)  Date Value  06/20/2021 471  07/11/2020 517  01/17/2020 771     Assessment & Plan:   Problem List Items Addressed This Visit       High   HIV (human immunodeficiency virus infection) ([place]) - Primary (Chronic)    Doing well on once daily Biktarvy with undetectable viral load and normal CD4 count. Refills provided today. Routine STI screening negative previously and not done today with monogamous relationship.  Flu shot advised in Sept/Oct.  Up to date on COVID vaccine.  RTC in  6-8 months       Relevant Medications   bictegravir-emtricitabine-tenofovir AF (BIKTARVY) 50-200-25 MG TABS tablet     Unprioritized   Hypercholesteremia   Relevant Orders   Ambulatory referral to Internal Medicine   Chronic pain    Referral placed to pain management today. Prefers [place] location vs [place] for Oxycodone management.  Relevant Orders   Ambulatory referral to Pain Clinic   Allergic reaction    Concerning story for hypersensitivity reaction to metformin - advised to stop this today and monitor for further symptoms. If he still has allergic symptoms will refer to allergy and asthma for consideration of food allergy work up.       Other Visit Diagnoses     Type 2 diabetes mellitus without complication, unspecified whether long term insulin use ([place])       Relevant Orders   Ambulatory referral to Internal Medicine      Janene Madeira, MSN, NP-C [place] for [place] Pager: [phone] Office: [phone]  07/05/21

## 2021-07-17 ENCOUNTER — Encounter: Payer: Self-pay | Admitting: Infectious Diseases

## 2021-10-22 ENCOUNTER — Ambulatory Visit: Payer: Medicaid Other | Admitting: Nurse Practitioner

## 2021-12-11 ENCOUNTER — Other Ambulatory Visit: Payer: Self-pay

## 2021-12-11 DIAGNOSIS — Z21 Asymptomatic human immunodeficiency virus [HIV] infection status: Secondary | ICD-10-CM

## 2021-12-11 DIAGNOSIS — Z113 Encounter for screening for infections with a predominantly sexual mode of transmission: Secondary | ICD-10-CM

## 2021-12-11 DIAGNOSIS — B20 Human immunodeficiency virus [HIV] disease: Secondary | ICD-10-CM

## 2021-12-13 ENCOUNTER — Other Ambulatory Visit: Payer: Medicaid Other

## 2021-12-27 ENCOUNTER — Encounter: Payer: Medicaid Other | Admitting: Infectious Diseases

## 2022-02-26 ENCOUNTER — Other Ambulatory Visit: Payer: Self-pay | Admitting: Infectious Diseases

## 2022-02-26 DIAGNOSIS — G8929 Other chronic pain: Secondary | ICD-10-CM

## 2022-02-27 NOTE — Telephone Encounter (Signed)
Okay to refill? 

## 2022-03-27 ENCOUNTER — Telehealth: Payer: Self-pay

## 2022-03-27 DIAGNOSIS — G8929 Other chronic pain: Secondary | ICD-10-CM

## 2022-03-27 MED ORDER — LISINOPRIL 20 MG PO TABS: 20.0000 mg | ORAL_TABLET | Freq: Every day | ORAL | 0 refills | Status: DC

## 2022-03-27 MED ORDER — PAROXETINE HCL 20 MG PO TABS: ORAL_TABLET | ORAL | 0 refills | Status: DC

## 2022-03-27 MED ORDER — AMLODIPINE BESYLATE 10 MG PO TABS: ORAL_TABLET | ORAL | 0 refills | Status: DC

## 2022-03-27 NOTE — Telephone Encounter (Signed)
Will do a 14m courtesy fill since he has been filling these regularly. Rx's sent in   Will see about getting him resources for primary care since he has active Medicaid and determine further fills after we get his BP and reassess his mood.  Thank you.

## 2022-03-27 NOTE — Addendum Note (Signed)
Addended by: Blanchard Kelch on: 03/27/2022 02:41 PM   Modules accepted: Orders

## 2022-03-27 NOTE — Telephone Encounter (Signed)
Received refill request for Lisinopril 20 mg, Paroxetine 20 mg, and Amlodipine 10 mg tablet from [facility]. Will forward message to provider to advise on refill. Juanita Laster, RMA

## 2022-04-08 ENCOUNTER — Other Ambulatory Visit: Payer: Self-pay

## 2022-04-08 ENCOUNTER — Ambulatory Visit: Payer: Medicaid Other | Admitting: Infectious Diseases

## 2022-04-08 ENCOUNTER — Encounter: Payer: Self-pay | Admitting: Infectious Diseases

## 2022-04-08 VITALS — BP 126/82 | HR 60 | Temp 99.0°F | Ht 66.0 in | Wt 226.0 lb

## 2022-04-08 DIAGNOSIS — E7800 Pure hypercholesterolemia, unspecified: Secondary | ICD-10-CM | POA: Diagnosis not present

## 2022-04-08 DIAGNOSIS — Z23 Encounter for immunization: Secondary | ICD-10-CM

## 2022-04-08 DIAGNOSIS — I10 Essential (primary) hypertension: Secondary | ICD-10-CM

## 2022-04-08 DIAGNOSIS — B20 Human immunodeficiency virus [HIV] disease: Secondary | ICD-10-CM | POA: Diagnosis present

## 2022-04-08 DIAGNOSIS — Z0000 Encounter for general adult medical examination without abnormal findings: Secondary | ICD-10-CM

## 2022-04-08 DIAGNOSIS — F32A Depression, unspecified: Secondary | ICD-10-CM

## 2022-04-08 DIAGNOSIS — Z87891 Personal history of nicotine dependence: Secondary | ICD-10-CM | POA: Diagnosis not present

## 2022-04-08 DIAGNOSIS — R7303 Prediabetes: Secondary | ICD-10-CM | POA: Diagnosis not present

## 2022-04-08 MED ORDER — AMLODIPINE BESYLATE 10 MG PO TABS: ORAL_TABLET | ORAL | 11 refills | Status: DC

## 2022-04-08 MED ORDER — PAROXETINE HCL 20 MG PO TABS: ORAL_TABLET | ORAL | 11 refills | Status: DC

## 2022-04-08 MED ORDER — BIKTARVY 50-200-25 MG PO TABS: 1.0000 | ORAL_TABLET | Freq: Every day | ORAL | 11 refills | Status: DC

## 2022-04-08 MED ORDER — ROSUVASTATIN CALCIUM 10 MG PO TABS
10.0000 mg | ORAL_TABLET | Freq: Every day | ORAL | 11 refills | Status: DC
Start: 2022-04-08 — End: 2023-09-30

## 2022-04-08 MED ORDER — LISINOPRIL 20 MG PO TABS: 20.0000 mg | ORAL_TABLET | Freq: Every day | ORAL | 11 refills | Status: DC

## 2022-04-08 NOTE — Assessment & Plan Note (Signed)
Doing well on once daily Biktarvy. No concern for s/e or treatment interruptions. Will refill x 1 year.   With new research coming out by way of the REPRIEVE study regarding CVD risk reduction for PLWH, discussed that over the 8 year trial initiation of statin therapy was shown to reduce CV disease by 35% independent of other risk factors.  We discussed the patient's 10-year CVD Risk Score to be > 7.5%, at least moderately elevated, and would benefit from intervention given HIV+ and > 14 yo.   Given co-morbidities will continue crestor 10 mg daily as he was tolerating in the past for primary prevention. Lifestyle recommendations also discussed today.   Return in about 6 months (around 10/09/2022).

## 2022-04-08 NOTE — Assessment & Plan Note (Signed)
Well maintained on current dose of Paxil. He would like to continue as titration off in the past has not been favorable.

## 2022-04-08 NOTE — Patient Instructions (Addendum)
Please continue your medications - I sent in refills for you.   Please start back taking the chlolesterol medication.   We gave you your last pneumonia booster and meningitis booster today.   Stop by the lab on your way out  See you back in 6 months! We can do labs 2 weeks ahead of time if you don't mind scheduling as such.

## 2022-04-08 NOTE — Assessment & Plan Note (Signed)
A1C, PSA today. Will discuss colon cancer screening and GI referral at return visit in 7m.  Prevnar 20 and Menveo booster's today.

## 2022-04-08 NOTE — Assessment & Plan Note (Signed)
Re-check A1C today. Last A1C 5.7%

## 2022-04-08 NOTE — Progress Notes (Signed)
Name: Roberto Woodard  DOB: 12-14-1970 MRN: [id number] PCP: Lucia Gaskins, MD (Inactive)    Brief Narrative:  Roberto Woodard is a 51 y.o. male with HIV infection, non-AIDS; Dx 2005 with CD4 nadir unknown (never on OI proph). HLA B*5701 (+).  HIV Risk: heterosexual.  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   Previous Regimens: Reyataz / Noravir / Truvada --> suppressed 05/2017: Descovy / Prezcobix --> suppressed (switched to reduce hyperglycemia a/w cobi) Biktarvy 2018 --> suppressed    Genotypes: Records not sent      Subjective:  CC:  HIV follow up care.  No concerns today.     HPI: Doing well with his medications. Taking biktarvy as prescribed without any challenges with side effects or access to medications. Last labs with VL < 20 and CD4 > 400.   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 Kids are doing well. Has not had the bandwidth to get a PCP yet.   He has been taking both of his HTN meds daily and has no side effects to these.  Also taking his paxil daily and would like to continue this for his mood treatment.  Has not had colonoscopy yet - his insurance sent out a stool collection kit to his home but he would prefer consideration for colonoscopy to screen instead.  Does not use tobacco or use excessive alcohol. Has been off diabetes medications. Stopped his statin in the past.   Sees pain management for chronic back pain r/t injury.     Review of Systems  Constitutional:  Negative for appetite change, chills, fatigue, fever and unexpected weight change.  Eyes:  Negative for visual disturbance.  Respiratory:  Negative for cough and shortness of breath.   Cardiovascular:  Negative for chest pain and leg swelling.  Gastrointestinal:  Negative for abdominal pain, diarrhea and nausea.  Genitourinary:  Negative for dysuria, genital sores and penile discharge.  Musculoskeletal:  Positive for back pain and  neck pain. Negative for joint swelling.  Skin:  Negative for color change and rash.  Neurological:  Negative for dizziness and headaches.  Hematological:  Negative for adenopathy.  Psychiatric/Behavioral:  Negative for sleep disturbance. The patient is not nervous/anxious.     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 60 capsule 3   oxyCODONE (OXY IR/ROXICODONE) 5 MG immediate release tablet Take 5 mg by mouth daily.     amLODipine (NORVASC) 10 MG tablet TAKE 1 TABLET(10 MG) BY MOUTH DAILY 30 tablet 0   bictegravir-emtricitabine-tenofovir AF (BIKTARVY) 50-200-25 MG TABS tablet Take 1 tablet by mouth daily. 30 tablet 11   lisinopril (ZESTRIL) 20 MG tablet Take 1 tablet (20 mg total) by mouth daily. 30 tablet 0   PARoxetine (PAXIL) 20 MG tablet TAKE 1 TABLET(20 MG) BY MOUTH DAILY 30 tablet 0   metFORMIN (GLUCOPHAGE) 500 MG tablet Take 500 mg by mouth 2 (two) times daily. (Patient not taking: Reported on 09/04/2020)     rosuvastatin (CRESTOR) 10 MG tablet Take 1 tablet (10 mg total) by mouth daily. (Patient not  taking: Reported on 04/08/2022) 30 tablet 5   No facility-administered medications prior to visit.     Allergies  Allergen Reactions   Metformin Swelling   Other     Sweet Peppers and arugala-- Hives and angioedema.     Social History   Tobacco Use   Smoking status: Former    Types: Cigarettes   Smokeless tobacco: Never  Substance Use Topics   Alcohol use: Yes    Comment: wine occasionally    Drug use: Yes    Types: Marijuana    Comment: twice a day     Social History   Substance and Sexual  Activity  Sexual Activity Yes   Partners: Female   Birth control/protection: Condom   Comment: declined condoms    Objective:   Vitals:   04/08/22 1105  BP: 126/82  Pulse: 60  Temp: 99 F (37.2 C)  TempSrc: Oral  SpO2: 97%  Weight: 226 lb (102.5 kg)  Height: _0  (1.676 m)   Body mass index is 36.48 kg/m.   Physical Exam Constitutional:      Appearance: Normal appearance. He is not ill-appearing.  HENT:     Head: Normocephalic.     Mouth/Throat:     Mouth: Mucous membranes are moist.     Pharynx: Oropharynx is clear.  Eyes:     General: No scleral icterus. Pulmonary:     Effort: Pulmonary effort is normal.  Musculoskeletal:        General: Normal range of motion.     Cervical back: Normal range of motion.  Skin:    Coloration: Skin is not jaundiced or pale.  Neurological:     Mental Status: He is alert and oriented to person, place, and time.  Psychiatric:        Mood and Affect: Mood normal.        Judgment: Judgment normal.    Lab Results Lab Results  Component Value Date   WBC 5.1 07/11/2020   HGB 12.9 (L) 07/11/2020   HCT 39.3 07/11/2020   MCV 85.2 07/11/2020   PLT 288 07/11/2020    Lab Results  Component Value Date   CREATININE 0.94 07/11/2020   BUN 17 07/11/2020   NA 136 07/11/2020   K 4.2 07/11/2020   CL 101 07/11/2020   CO2 27 07/11/2020    Lab Results  Component Value Date   ALT 22 07/11/2020   AST 24 07/11/2020   ALKPHOS 97 05/18/2017   BILITOT 0.4 07/11/2020    Lab Results  Component Value Date   CHOL 206 (H) 07/11/2020   HDL 50 07/11/2020   LDLCALC 135 (H) 07/11/2020   TRIG 107 07/11/2020   CHOLHDL 4.1 07/11/2020   HIV 1 RNA Quant  Date Value  06/20/2021 Not Detected Copies/mL  07/11/2020 <20 Copies/mL  01/17/2020 <20 DETECTED copies/mL (A)   CD4 T Cell Abs (/uL)  Date Value  06/20/2021 471  07/11/2020 517  01/17/2020 771     Assessment & Plan:   Problem List Items Addressed This Visit       High   HIV  (human immunodeficiency virus infection) ([place]) - Primary (Chronic)    Doing well on once daily Biktarvy. No concern for s/e or treatment interruptions. Will refill x 1 year.   With new research coming out by way of the REPRIEVE study regarding CVD risk reduction for PLWH, discussed that over the 8 year trial initiation of statin therapy was shown to reduce CV disease by  35% independent of other risk factors.  We discussed the patient's 10-year CVD Risk Score to be > 7.5%, at least moderately elevated, and would benefit from intervention given HIV+ and > 22 yo.   Given co-morbidities will continue crestor 10 mg daily as he was tolerating in the past for primary prevention. Lifestyle recommendations also discussed today.   Return in about 6 months (around 10/09/2022).       Relevant Medications   bictegravir-emtricitabine-tenofovir AF (BIKTARVY) 50-200-25 MG TABS tablet   Other Relevant Orders   CBC   COMPLETE METABOLIC PANEL WITH GFR   T-helper cells (CD4) count (not at [facility])   HIV 1 RNA quant-no reflex-bld   MENINGOCOCCAL MCV4O (Completed)   Pneumococcal conjugate vaccine 20-valent (Completed)     Unprioritized   Hypercholesteremia   Relevant Medications   rosuvastatin (CRESTOR) 10 MG tablet   amLODipine (NORVASC) 10 MG tablet   lisinopril (ZESTRIL) 20 MG tablet   Other Relevant Orders   Lipid panel   Depression    Well maintained on current dose of Paxil. He would like to continue as titration off in the past has not been favorable.        Relevant Medications   PARoxetine (PAXIL) 20 MG tablet   Hypertension    BP Readings from Last 3 Encounters:  04/08/22 126/82  07/05/21 (!) 135/98  09/04/20 122/86  Well controlled - continue lisinopril and amlodipine. CMP today.       Relevant Medications   rosuvastatin (CRESTOR) 10 MG tablet   amLODipine (NORVASC) 10 MG tablet   lisinopril (ZESTRIL) 20 MG tablet   Prediabetes    Re-check A1C today. Last A1C 5.7%       Relevant  Orders   Hemoglobin A1c   Preventative health care    A1C, PSA today. Will discuss colon cancer screening and GI referral at return visit in 57m  Prevnar 20 and Menveo booster's today.        Relevant Orders   PSA   Other Visit Diagnoses     Need for pneumococcal vaccination       Relevant Orders   Pneumococcal conjugate vaccine 20-valent (Completed)   Need for meningococcal vaccination       Relevant Orders   MENINGOCOCCAL MCV4O (Completed)       SJanene Madeira MSN, NP-C [place]ager: [phone]: [phone] 04/08/22

## 2022-04-08 NOTE — Assessment & Plan Note (Signed)
BP Readings from Last 3 Encounters:  04/08/22 126/82  07/05/21 (!) 135/98  09/04/20 122/86   Well controlled - continue lisinopril and amlodipine. CMP today.

## 2022-04-10 LAB — T-HELPER CELLS (CD4) COUNT (NOT AT ARMC)
CD4 % Helper T Cell: 34 % (ref 33–65)
CD4 T Cell Abs: 418 /uL (ref 400–1790)

## 2022-04-10 LAB — CBC
HCT: 42.4 % (ref 38.5–50.0)
Hemoglobin: 14.1 g/dL (ref 13.2–17.1)
MCH: 28.4 pg (ref 27.0–33.0)
MCHC: 33.3 g/dL (ref 32.0–36.0)
MCV: 85.5 fL (ref 80.0–100.0)
MPV: 11.4 fL (ref 7.5–12.5)
Platelets: 285 10*3/uL (ref 140–400)
RBC: 4.96 10*6/uL (ref 4.20–5.80)
RDW: 13.6 % (ref 11.0–15.0)
WBC: 6.9 10*3/uL (ref 3.8–10.8)

## 2022-04-10 LAB — COMPLETE METABOLIC PANEL WITH GFR
AG Ratio: 1.3 (calc) (ref 1.0–2.5)
ALT: 21 U/L (ref 9–46)
AST: 22 U/L (ref 10–40)
Albumin: 4.5 g/dL (ref 3.6–5.1)
Alkaline phosphatase (APISO): 80 U/L (ref 36–130)
BUN: 17 mg/dL (ref 7–25)
CO2: 24 mmol/L (ref 20–32)
Calcium: 9.6 mg/dL (ref 8.6–10.3)
Chloride: 103 mmol/L (ref 98–110)
Creat: 0.91 mg/dL (ref 0.60–1.29)
Globulin: 3.4 g/dL (calc) (ref 1.9–3.7)
Glucose, Bld: 116 mg/dL — ABNORMAL HIGH (ref 65–99)
Potassium: 4.4 mmol/L (ref 3.5–5.3)
Sodium: 136 mmol/L (ref 135–146)
Total Bilirubin: 0.4 mg/dL (ref 0.2–1.2)
Total Protein: 7.9 g/dL (ref 6.1–8.1)
eGFR: 104 mL/min/{1.73_m2} (ref >=60)

## 2022-04-10 LAB — LIPID PANEL
Cholesterol: 225 mg/dL — ABNORMAL HIGH (ref <200)
HDL: 64 mg/dL (ref >=40)
LDL Cholesterol (Calc): 142 mg/dL (calc) — ABNORMAL HIGH
Non-HDL Cholesterol (Calc): 161 mg/dL (calc) — ABNORMAL HIGH (ref <130)
Total CHOL/HDL Ratio: 3.5 (calc) (ref <5.0)
Triglycerides: 84 mg/dL (ref <150)

## 2022-04-10 LAB — HEMOGLOBIN A1C
Hgb A1c MFr Bld: 5.7 % of total Hgb — ABNORMAL HIGH (ref <5.7)
Mean Plasma Glucose: 117 mg/dL
eAG (mmol/L): 6.5 mmol/L

## 2022-04-10 LAB — HIV-1 RNA QUANT-NO REFLEX-BLD
HIV 1 RNA Quant: <20 copies/mL — AB
HIV-1 RNA Quant, Log: <1.3 Log copies/mL — AB

## 2022-04-10 LAB — PSA: PSA: 0.74 ng/mL (ref <=4.00)

## 2022-04-28 ENCOUNTER — Other Ambulatory Visit: Payer: Self-pay

## 2022-04-28 ENCOUNTER — Telehealth: Payer: Self-pay

## 2022-04-28 DIAGNOSIS — I10 Essential (primary) hypertension: Secondary | ICD-10-CM

## 2022-04-28 DIAGNOSIS — F32A Depression, unspecified: Secondary | ICD-10-CM

## 2022-04-28 MED ORDER — PAROXETINE HCL 20 MG PO TABS: ORAL_TABLET | ORAL | 4 refills | Status: DC

## 2022-04-28 MED ORDER — LISINOPRIL 20 MG PO TABS: 20.0000 mg | ORAL_TABLET | Freq: Every day | ORAL | 4 refills | Status: DC

## 2022-04-28 MED ORDER — AMLODIPINE BESYLATE 10 MG PO TABS: ORAL_TABLET | ORAL | 4 refills | Status: DC

## 2022-04-28 NOTE — Telephone Encounter (Signed)
Called patient regarding refill request for Amlodipine, Lisinopril, and Paxil. Per chart review medication refill at last appointment for one year.  Spoke with patient who states that he does not use [facility] and would like prescriptions resent to [facility]. Okay to resend per provider. Encouraged patient to establish care with PCP to help manage these prescriptions and adjust as needed.  Patient verbalized understanding.  Prescriptions resent to preferred pharmacy. Canceled prescription sent to [facility]. Juanita Laster, RMA

## 2022-07-31 ENCOUNTER — Other Ambulatory Visit: Payer: Self-pay

## 2022-07-31 DIAGNOSIS — B20 Human immunodeficiency virus [HIV] disease: Secondary | ICD-10-CM

## 2022-07-31 MED ORDER — BIKTARVY 50-200-25 MG PO TABS: 1.0000 | ORAL_TABLET | Freq: Every day | ORAL | 5 refills | Status: DC

## 2022-09-23 ENCOUNTER — Other Ambulatory Visit: Payer: Self-pay

## 2022-09-23 ENCOUNTER — Encounter: Payer: Self-pay | Admitting: Infectious Diseases

## 2022-09-23 ENCOUNTER — Ambulatory Visit: Payer: Self-pay

## 2022-09-23 ENCOUNTER — Ambulatory Visit (INDEPENDENT_AMBULATORY_CARE_PROVIDER_SITE_OTHER): Payer: Self-pay | Admitting: Infectious Diseases

## 2022-09-23 VITALS — BP 142/82 | HR 83 | Resp 16 | Ht 66.0 in | Wt 237.0 lb

## 2022-09-23 DIAGNOSIS — E7800 Pure hypercholesterolemia, unspecified: Secondary | ICD-10-CM

## 2022-09-23 DIAGNOSIS — Z23 Encounter for immunization: Secondary | ICD-10-CM

## 2022-09-23 DIAGNOSIS — M67432 Ganglion, left wrist: Secondary | ICD-10-CM

## 2022-09-23 DIAGNOSIS — Z87891 Personal history of nicotine dependence: Secondary | ICD-10-CM

## 2022-09-23 DIAGNOSIS — F32A Depression, unspecified: Secondary | ICD-10-CM

## 2022-09-23 DIAGNOSIS — B20 Human immunodeficiency virus [HIV] disease: Secondary | ICD-10-CM

## 2022-09-23 DIAGNOSIS — I10 Essential (primary) hypertension: Secondary | ICD-10-CM

## 2022-09-23 MED ORDER — AMLODIPINE BESYLATE 10 MG PO TABS: ORAL_TABLET | ORAL | 11 refills | Status: DC

## 2022-09-23 MED ORDER — LISINOPRIL 20 MG PO TABS: 20.0000 mg | ORAL_TABLET | Freq: Every day | ORAL | 11 refills | Status: DC

## 2022-09-23 MED ORDER — PAROXETINE HCL 20 MG PO TABS: ORAL_TABLET | ORAL | 11 refills | Status: DC

## 2022-09-23 MED ORDER — BIKTARVY 50-200-25 MG PO TABS: 1.0000 | ORAL_TABLET | Freq: Every day | ORAL | 11 refills | Status: DC

## 2022-09-23 NOTE — Assessment & Plan Note (Signed)
Quite vague and starting to affect range of motion of the left wrist.  He is interested in surgical consultation for consideration of removal.

## 2022-09-23 NOTE — Assessment & Plan Note (Signed)
Very well controlled on once daily Biktarvy. No concerns with access or adherence to medication. They are tolerating the medication well without side effects. No drug interactions identified. Pertinent lab tests ordered today.  No changes to insurance coverage.  No dental needs today.  No concern over anxious/depressed mood.  Sexual health and family planning discussed - no needs today. No current plans to expand family.  Vaccines updated today - see health maintenance section.   Return in about 6 months (around 03/24/2023).

## 2022-09-23 NOTE — Assessment & Plan Note (Signed)
Well controlled and stable on current dose of Paxil 20 mg daily.  Refills provided

## 2022-09-23 NOTE — Patient Instructions (Addendum)
So nice to see you!   Keep taking your [place] everyday.   [facility] [address] [place],  [place]  [address] [phone]   For the constipation -  Make sure you get plenty of water everyday  Medications that may help:  Colace (stool softener) Senna (stool softener and helps to move things along) Miralax powder (laxative) Magnesium Citrate Capsules 1-2 capsules at night.  Benefiber to your water / coffee daily  Metamucil is also a good option.   Squatty Potty is also helpful!  This should also help the hemorrhoids.   See you in 6 months!

## 2022-09-23 NOTE — Progress Notes (Signed)
Name: Roberto Woodard  DOB: Jul 25, 1971 MRN: [id number] PCP: Lucia Gaskins, MD (Inactive)    Brief Narrative:  Roberto Woodard is a 51 y.o. male with HIV infection, non-AIDS; Dx 2005 with CD4 nadir unknown (never on OI proph). HLA B*5701 (+).  HIV Risk: heterosexual.  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   Previous Regimens: Reyataz / Noravir / Truvada --> suppressed 05/2017: Descovy / Prezcobix --> suppressed (switched to reduce hyperglycemia a/w cobi) Biktarvy 2018 --> suppressed    Genotypes: Records not availible      Subjective:   Chief Complaint  Patient presents with   Follow-up      HPI: Roberto Woodard is here for 38mfollow up, routine HIV care. He has been doing well since last OV.  Quit smoking! 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   No changes with biktarvy and continues to take that everyday. No trouble with access or side effects.   Takes BP medications and cholesterol medicines as prescribed. No side effects here.   Main concern is constipation and possible hemorrhoids today. He is worried about this and long term effect. States stool is hard and firm. Difficult to pass. Has had to increase rx pain meds and feels this has affected it. Hs not tried anything OTC but open to suggestions.   He would also like help managing a lump on the left dorsal wrist. Has always been there to some dgree    Review of Systems  Constitutional:  Negative for appetite change, chills, fatigue, fever and unexpected weight change.  Eyes:  Negative for visual disturbance.  Respiratory:  Negative for cough and shortness of breath.   Cardiovascular:  Negative for chest pain and leg swelling.  Gastrointestinal:  Negative for abdominal pain, diarrhea and nausea.  Genitourinary:  Negative for dysuria, genital sores and  penile discharge.  Musculoskeletal:  Positive for back pain and neck pain. Negative for joint swelling.  Skin:  Negative for color change and rash.  Neurological:  Negative for dizziness and headaches.  Hematological:  Negative for adenopathy.  Psychiatric/Behavioral:  Negative for sleep disturbance. The patient is not nervous/anxious.      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 60 capsule 3   oxyCODONE (OXY IR/ROXICODONE) 5 MG immediate release tablet Take 5 mg by mouth daily.     rosuvastatin (CRESTOR) 10 MG tablet Take 1 tablet (10 mg total) by mouth daily. 30 tablet 11   amLODipine (NORVASC) 10 MG tablet TAKE 1 TABLET(10 MG) BY MOUTH DAILY 30 tablet 4   bictegravir-emtricitabine-tenofovir AF (BIKTARVY) 50-200-25 MG TABS tablet Take 1 tablet by mouth daily. 30 tablet 5   lisinopril (ZESTRIL) 20 MG tablet Take 1 tablet (20 mg total) by mouth daily. 30 tablet 4   PARoxetine (PAXIL) 20 MG tablet TAKE 1 TABLET(20 MG) BY MOUTH DAILY 30 tablet 4   No facility-administered medications prior to visit.     Allergies  Allergen Reactions  Metformin Swelling   Other     Sweet Peppers and arugala-- Hives and angioedema.     Social History   Tobacco Use   Smoking status: Former    Types: Cigarettes   Smokeless tobacco: Never  Substance Use Topics   Alcohol use: Yes    Comment: wine occasionally    Drug use: Yes    Types: Marijuana    Comment: twice a day     Social History   Substance and Sexual Activity  Sexual Activity Yes   Partners: Female   Birth control/protection: Condom   Comment:  declined condoms    Objective:   Vitals:   09/23/22 0905  BP: (!) 142/82  Pulse: 83  Resp: 16  SpO2: 97%  Weight: 237 lb (107.5 kg)  Height: _0  (1.676 m)   Body mass index is 38.25 kg/m.   Physical Exam Constitutional:      Appearance: Normal appearance. He is not ill-appearing.  HENT:     Head: Normocephalic.     Mouth/Throat:     Mouth: Mucous membranes are moist.     Pharynx: Oropharynx is clear.  Eyes:     General: No scleral icterus. Pulmonary:     Effort: Pulmonary effort is normal.  Musculoskeletal:        General: Normal range of motion.       Arms:     Cervical back: Normal range of motion.     Comments: Large greater than 2 cm soft mobile mass palpated on the dorsal aspect of the left wrist.  There is no signs of any erythema, tenderness or induration.  Skin colored consistent with cyst.  Skin:    Coloration: Skin is not jaundiced or pale.  Neurological:     Mental Status: He is alert and oriented to person, place, and time.  Psychiatric:        Mood and Affect: Mood normal.        Judgment: Judgment normal.     Lab Results Lab Results  Component Value Date   WBC 6.9 04/08/2022   HGB 14.1 04/08/2022   HCT 42.4 04/08/2022   MCV 85.5 04/08/2022   PLT 285 04/08/2022    Lab Results  Component Value Date   CREATININE 0.91 04/08/2022   BUN 17 04/08/2022   NA 136 04/08/2022   K 4.4 04/08/2022   CL 103 04/08/2022   CO2 24 04/08/2022    Lab Results  Component Value Date   ALT 21 04/08/2022   AST 22 04/08/2022   ALKPHOS 97 05/18/2017   BILITOT 0.4 04/08/2022    Lab Results  Component Value Date   CHOL 225 (H) 04/08/2022   HDL 64 04/08/2022   LDLCALC 142 (H) 04/08/2022   TRIG 84 04/08/2022   CHOLHDL 3.5 04/08/2022   HIV 1 RNA Quant  Date Value  04/08/2022 <20 DETECTED copies/mL (A)  06/20/2021 Not Detected Copies/mL  07/11/2020 <20 Copies/mL   CD4 T Cell Abs (/uL)  Date Value  04/08/2022 418  06/20/2021 471  07/11/2020 517      Assessment & Plan:   Problem List Items Addressed This Visit       High   HIV (human immunodeficiency virus infection) ([place]) - Primary (Chronic)    Very well controlled on once daily Biktarvy. No concerns with access or adherence to medication. They are tolerating the medication well without side effects. No drug interactions identified. Pertinent lab tests ordered today.  No changes to insurance coverage.  No dental needs today.  No concern over anxious/depressed mood.  Sexual health and family planning discussed - no needs today. No current plans to expand family.  Vaccines updated today - see health maintenance section.   Return in about 6 months (around 03/24/2023).        Relevant Medications   bictegravir-emtricitabine-tenofovir AF (BIKTARVY) 50-200-25 MG TABS tablet   Other Relevant Orders   HIV 1 RNA quant-no reflex-bld   T-helper cells (CD4) count   RPR     Unprioritized   Hypercholesteremia    Continue rosuvastatin once daily.       Relevant Medications   amLODipine (NORVASC) 10 MG tablet   lisinopril (ZESTRIL) 20 MG tablet   Hypertension    Continue lisinopril 20 mg and amlodipine 10 mg once daily.       Relevant Medications   amLODipine (NORVASC) 10 MG tablet   PARoxetine (PAXIL) 20 MG tablet   lisinopril (ZESTRIL) 20 MG tablet   Depression    Well controlled and stable on current dose of Paxil 20 mg daily.  Refills provided      Relevant Medications   amLODipine (NORVASC) 10 MG tablet   PARoxetine (PAXIL) 20 MG tablet   lisinopril (ZESTRIL) 20 MG tablet   Ganglion cyst of dorsum of left wrist    Quite vague and starting to affect range of motion of the left wrist.  He is interested in surgical consultation for consideration of removal.      Relevant Orders   Ambulatory referral to Orthopedic Surgery   Other Visit Diagnoses     Need for immunization against influenza       Relevant Orders   Flu Vaccine QUAD 59moIM (Fluarix, Fluzone &  Alfiuria Quad PF) (Completed)      SJanene Madeira MSN, NP-C [facility] SagePager: [phone]: [phone] 09/23/22

## 2022-09-23 NOTE — Assessment & Plan Note (Signed)
Continue rosuvastatin once daily.

## 2022-09-23 NOTE — Assessment & Plan Note (Signed)
Continue lisinopril 20 mg and amlodipine 10 mg once daily.

## 2022-09-24 LAB — T-HELPER CELLS (CD4) COUNT (NOT AT ARMC)
CD4 % Helper T Cell: 33 % (ref 33–65)
CD4 T Cell Abs: 433 /uL (ref 400–1790)

## 2022-09-25 ENCOUNTER — Ambulatory Visit: Payer: Self-pay | Admitting: Orthopaedic Surgery

## 2022-09-26 LAB — HIV-1 RNA QUANT-NO REFLEX-BLD
HIV 1 RNA Quant: <20 Copies/mL — ABNORMAL HIGH
HIV-1 RNA Quant, Log: <1.3 Log cps/mL — ABNORMAL HIGH

## 2022-09-26 LAB — RPR: RPR Ser Ql: NONREACTIVE

## 2022-09-30 ENCOUNTER — Telehealth: Payer: Self-pay

## 2022-09-30 NOTE — Telephone Encounter (Signed)
-----   Message from Blanchard Kelch, NP sent at 09/30/2022  1:21 PM EST ----- See previous documentation on result note for specifics.  I forgot to route it to you when I wrote it first time

## 2022-09-30 NOTE — Telephone Encounter (Signed)
Patient aware of results. Patient did want to inform you that he didn't go to orthopedic because he doesn't have insurance. He questioned why he didn't qualify for medicaid. I advised him to contact social services and speak to a caseworker to see if he can apply/qualify.    Therisa Mennella Lesli Albee, CMA

## 2022-10-09 ENCOUNTER — Ambulatory Visit: Payer: Medicaid Other | Admitting: Orthopaedic Surgery

## 2022-10-14 DIAGNOSIS — I10 Essential (primary) hypertension: Secondary | ICD-10-CM | POA: Diagnosis not present

## 2022-10-14 DIAGNOSIS — Z79891 Long term (current) use of opiate analgesic: Secondary | ICD-10-CM | POA: Diagnosis not present

## 2022-10-14 DIAGNOSIS — M545 Low back pain, unspecified: Secondary | ICD-10-CM | POA: Diagnosis not present

## 2022-10-14 DIAGNOSIS — M961 Postlaminectomy syndrome, not elsewhere classified: Secondary | ICD-10-CM | POA: Diagnosis not present

## 2022-10-27 DIAGNOSIS — I10 Essential (primary) hypertension: Secondary | ICD-10-CM | POA: Diagnosis not present

## 2022-10-27 DIAGNOSIS — R69 Illness, unspecified: Secondary | ICD-10-CM | POA: Diagnosis not present

## 2022-10-27 DIAGNOSIS — G8929 Other chronic pain: Secondary | ICD-10-CM | POA: Diagnosis not present

## 2022-10-27 DIAGNOSIS — Z6839 Body mass index (BMI) 39.0-39.9, adult: Secondary | ICD-10-CM | POA: Diagnosis not present

## 2022-10-27 DIAGNOSIS — Z79891 Long term (current) use of opiate analgesic: Secondary | ICD-10-CM | POA: Diagnosis not present

## 2023-03-17 ENCOUNTER — Ambulatory Visit (INDEPENDENT_AMBULATORY_CARE_PROVIDER_SITE_OTHER): Payer: 59 | Admitting: Infectious Diseases

## 2023-03-17 ENCOUNTER — Other Ambulatory Visit: Payer: Self-pay

## 2023-03-17 ENCOUNTER — Encounter: Payer: Self-pay | Admitting: Infectious Diseases

## 2023-03-17 VITALS — BP 148/97 | HR 77 | Temp 98.0°F | Wt 251.0 lb

## 2023-03-17 DIAGNOSIS — E7800 Pure hypercholesterolemia, unspecified: Secondary | ICD-10-CM

## 2023-03-17 DIAGNOSIS — G8929 Other chronic pain: Secondary | ICD-10-CM

## 2023-03-17 DIAGNOSIS — B20 Human immunodeficiency virus [HIV] disease: Secondary | ICD-10-CM

## 2023-03-17 DIAGNOSIS — I10 Essential (primary) hypertension: Secondary | ICD-10-CM

## 2023-03-17 DIAGNOSIS — R7303 Prediabetes: Secondary | ICD-10-CM

## 2023-03-17 DIAGNOSIS — Z113 Encounter for screening for infections with a predominantly sexual mode of transmission: Secondary | ICD-10-CM

## 2023-03-17 LAB — CBC
MCHC: 32 g/dL (ref 32.0–36.0)
RDW: 14 % (ref 11.0–15.0)
WBC: 5.1 10*3/uL (ref 3.8–10.8)

## 2023-03-17 MED ORDER — OXYCODONE HCL 5 MG PO TABS: 5.0000 mg | ORAL_TABLET | Freq: Two times a day (BID) | ORAL | 0 refills | Status: DC

## 2023-03-17 NOTE — Addendum Note (Signed)
Addended by: Juanita Laster on: 03/17/2023 02:17 PM   Modules accepted: Orders

## 2023-03-17 NOTE — Patient Instructions (Addendum)
Nice to see you today!   Please continue all your medications - you should have refills until November. We may need to increase your cholesterol pill. We will let you know  For Pain Management I will put the referral to: [facility] [address] [place], [place], [address] [phone]

## 2023-03-17 NOTE — Progress Notes (Signed)
Name: Roberto Woodard  DOB: 11/07/1971 MRN: [id number] PCP: Oval Linsey, MD (Inactive)    Brief Narrative:  Roberto Woodard is a 52 y.o. male with HIV infection, non-AIDS; Dx 2005 with CD4 nadir unknown (never on OI proph). HLA B*5701 (+).  HIV Risk: heterosexual.  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   Previous Regimens: Reyataz / Noravir / Truvada --> suppressed 05/2017: Descovy / Prezcobix --> suppressed (switched to reduce hyperglycemia a/w cobi) Biktarvy 2018 --> suppressed    Genotypes: Records not availible      Subjective:   Chief Complaint  Patient presents with   Follow-up    Pain clinic referral/       HPI: Roberto Woodard is here for 14m follow up, routine HIV care.  He has been doing well since last OV.  Continues to abstain from tobacco products still (been almost a year now).  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   Lost pain management center d/t provider closing clinic in [place]. He is requesting a one time fill today. Stopped using marijuana d/t paranoia associated with it for a few months now. It was helping with his pain control but it was not worth  the side effects.  He continues on his blood pressure medications, cholesterol medications, paxil. Reports all is going well. Family is doing great. Roberto Woodard is 5 now!   Review of Systems  Musculoskeletal:  Positive for back pain and neck pain.  All other systems reviewed and are negative.    Past Medical History:  Diagnosis Date   (QFT) QuantiFERON-TB test reaction without active tuberculosis 05/25/2017   Elevated blood pressure reading 06/15/2017   HIV (human immunodeficiency virus infection) (HCC) 05/25/2017   Dx 2005   Hyperlipidemia     Outpatient Medications Prior to Visit  Medication Sig Dispense Refill   amLODipine  (NORVASC) 10 MG tablet TAKE 1 TABLET(10 MG) BY MOUTH DAILY 30 tablet 11   bictegravir-emtricitabine-tenofovir AF (BIKTARVY) 50-200-25 MG TABS tablet Take 1 tablet by mouth daily. 30 tablet 11   lisinopril (ZESTRIL) 20 MG tablet Take 1 tablet (20 mg total) by mouth daily. 30 tablet 11   PARoxetine (PAXIL) 20 MG tablet TAKE 1 TABLET(20 MG) BY MOUTH DAILY 30 tablet 11   rosuvastatin (CRESTOR) 10 MG tablet Take 1 tablet (10 mg total) by mouth daily. 30 tablet 11   gabapentin (NEURONTIN) 300 MG capsule Take 1 capsule (300 mg total) by mouth 2 (two) times daily. 60 capsule 3   oxyCODONE (OXY IR/ROXICODONE) 5 MG immediate release tablet Take 5 mg by mouth daily.     No facility-administered medications prior to visit.     Allergies  Allergen Reactions   Metformin Swelling   Other     Sweet Peppers and arugala-- Hives and angioedema.     Social History   Tobacco Use   Smoking status: Former    Types: Cigarettes   Smokeless tobacco: Never  Substance Use Topics   Alcohol use: Yes    Comment: wine occasionally    Drug use: Yes    Types: Marijuana    Comment: twice a day     Social History   Substance and Sexual Activity  Sexual Activity Yes   Partners: Female   Birth control/protection: Condom   Comment: declined condoms    Objective:  Vitals:   03/17/23 0930  BP: (!) 148/97  Pulse: 77  Temp: 98 F (36.7 C)  TempSrc: Oral  SpO2: 100%  Weight: 251 lb (113.9 kg)   Body mass index is 40.51 kg/m.   Physical Exam Vitals reviewed.  Constitutional:      Appearance: He is well-developed.     Comments: Seated comfortably in chair during visit.   HENT:     Mouth/Throat:     Dentition: Normal dentition. No dental abscesses.  Cardiovascular:     Rate and Rhythm: Normal rate and regular rhythm.     Heart sounds: Normal heart sounds.  Pulmonary:     Effort: Pulmonary effort is normal.     Breath sounds: Normal breath sounds.  Abdominal:     General: There is no  distension.     Palpations: Abdomen is soft.     Tenderness: There is no abdominal tenderness.  Lymphadenopathy:     Cervical: No cervical adenopathy.  Skin:    General: Skin is warm and dry.     Findings: No rash.  Neurological:     Mental Status: He is alert and oriented to person, place, and time.  Psychiatric:        Judgment: Judgment normal.     Comments: In good spirits today and engaged in care discussion.      Lab Results Lab Results  Component Value Date   WBC 6.9 04/08/2022   HGB 14.1 04/08/2022   HCT 42.4 04/08/2022   MCV 85.5 04/08/2022   PLT 285 04/08/2022    Lab Results  Component Value Date   CREATININE 0.91 04/08/2022   BUN 17 04/08/2022   NA 136 04/08/2022   K 4.4 04/08/2022   CL 103 04/08/2022   CO2 24 04/08/2022    Lab Results  Component Value Date   ALT 21 04/08/2022   AST 22 04/08/2022   ALKPHOS 97 05/18/2017   BILITOT 0.4 04/08/2022    Lab Results  Component Value Date   CHOL 225 (H) 04/08/2022   HDL 64 04/08/2022   LDLCALC 142 (H) 04/08/2022   TRIG 84 04/08/2022   CHOLHDL 3.5 04/08/2022   HIV 1 RNA Quant  Date Value  09/23/2022 <20 Copies/mL (H)  04/08/2022 <20 DETECTED copies/mL (A)  06/20/2021 Not Detected Copies/mL   CD4 T Cell Abs (/uL)  Date Value  09/23/2022 433  04/08/2022 418  06/20/2021 471     Assessment & Plan:   Problem List Items Addressed This Visit       High   HIV (human immunodeficiency virus infection) (HCC) (Chronic)    Very well controlled on once daily biktarvy. No concerns with access or adherence to medication. They are tolerating the medication well without side effects. No drug interactions identified. Pertinent lab tests ordered today.  Reminded about ADAP re-enrollment in July.  No dental needs today.  No concern over anxious/depressed mood - continue current dose of paxil  Sexual health and family planning discussed - routine asymptomatic testing ordered.  Vaccines updated today - see health  maintenance section.   Return in about 6 months (around 09/17/2023).        Relevant Orders   HIV 1 RNA quant-no reflex-bld   CBC     Unprioritized   Hypercholesteremia    Repeat lipids today - may need to increase to 10 mg  Rescreen for diabetes today      Relevant Orders   Lipid panel   Hypertension  Continue amlodipine and lisinopril at current doses. Will repeat next OV and if elevated up titrate.  Urinalysis today.       Relevant Orders   COMPLETE METABOLIC PANEL WITH GFR   Urinalysis   Chronic pain - Primary    [place] Database reviewed with 1 fill in February 01, 2023. Single site / provider observed. No other medications on there that are suspicious.  Will agree to do 1 30-d fill for percocet 5 mg BID and pain clinic referral.       Relevant Medications   oxyCODONE (OXY IR/ROXICODONE) 5 MG immediate release tablet   Other Relevant Orders   Ambulatory referral to Pain Clinic   Prediabetes   Relevant Orders   Hemoglobin A1c   Other Visit Diagnoses     Screening examination for venereal disease       Relevant Orders   Urine cytology ancillary only      Rexene Alberts, MSN, NP-C [facility] Pager: [phone] Office: [phone]  03/17/23

## 2023-03-17 NOTE — Assessment & Plan Note (Signed)
Continue amlodipine and lisinopril at current doses. Will repeat next OV and if elevated up titrate.  Urinalysis today.

## 2023-03-17 NOTE — Assessment & Plan Note (Signed)
Repeat lipids today - may need to increase to 10 mg  Rescreen for diabetes today

## 2023-03-17 NOTE — Assessment & Plan Note (Signed)
[place] Database reviewed with 1 fill in February 01, 2023. Single site / provider observed. No other medications on there that are suspicious.  Will agree to do 1 30-d fill for percocet 5 mg BID and pain clinic referral.

## 2023-03-17 NOTE — Assessment & Plan Note (Signed)
Very well controlled on once daily biktarvy. No concerns with access or adherence to medication. They are tolerating the medication well without side effects. No drug interactions identified. Pertinent lab tests ordered today.  Reminded about ADAP re-enrollment in July.  No dental needs today.  No concern over anxious/depressed mood - continue current dose of paxil  Sexual health and family planning discussed - routine asymptomatic testing ordered.  Vaccines updated today - see health maintenance section.   Return in about 6 months (around 09/17/2023).

## 2023-03-18 LAB — URINE CYTOLOGY ANCILLARY ONLY
Chlamydia: NEGATIVE
Comment: NEGATIVE
Comment: NORMAL
Neisseria Gonorrhea: NEGATIVE

## 2023-03-18 LAB — URINALYSIS
Bilirubin Urine: NEGATIVE
Glucose, UA: NEGATIVE
Hgb urine dipstick: NEGATIVE
Ketones, ur: NEGATIVE
Leukocytes,Ua: NEGATIVE
Nitrite: NEGATIVE
Protein, ur: NEGATIVE
Specific Gravity, Urine: 1.019 (ref 1.001–1.035)
pH: 8 (ref 5.0–8.0)

## 2023-03-18 LAB — COMPLETE METABOLIC PANEL WITH GFR
Creat: 0.84 mg/dL (ref 0.60–1.29)
Total Bilirubin: 0.5 mg/dL (ref 0.2–1.2)

## 2023-03-18 LAB — LIPID PANEL: Non-HDL Cholesterol (Calc): 180 mg/dL (calc) — ABNORMAL HIGH (ref <130)

## 2023-03-18 LAB — T-HELPER CELL (CD4) - (RCID CLINIC ONLY)
CD4 % Helper T Cell: 33 % (ref 33–65)
CD4 T Cell Abs: 368 /uL — ABNORMAL LOW (ref 400–1790)

## 2023-03-18 LAB — HEMOGLOBIN A1C: eAG (mmol/L): 7.4 mmol/L

## 2023-03-20 LAB — LIPID PANEL
Cholesterol: 241 mg/dL — ABNORMAL HIGH (ref <200)
HDL: 61 mg/dL (ref >=40)
LDL Cholesterol (Calc): 156 mg/dL (calc) — ABNORMAL HIGH
Total CHOL/HDL Ratio: 4 (calc) (ref <5.0)
Triglycerides: 120 mg/dL (ref <150)

## 2023-03-20 LAB — COMPLETE METABOLIC PANEL WITH GFR
AG Ratio: 1.5 (calc) (ref 1.0–2.5)
ALT: 69 U/L — ABNORMAL HIGH (ref 9–46)
AST: 43 U/L — ABNORMAL HIGH (ref 10–40)
Albumin: 4.6 g/dL (ref 3.6–5.1)
Alkaline phosphatase (APISO): 79 U/L (ref 36–130)
BUN: 16 mg/dL (ref 7–25)
CO2: 25 mmol/L (ref 20–32)
Calcium: 9.9 mg/dL (ref 8.6–10.3)
Chloride: 101 mmol/L (ref 98–110)
Globulin: 3.1 g/dL (calc) (ref 1.9–3.7)
Glucose, Bld: 132 mg/dL — ABNORMAL HIGH (ref 65–99)
Potassium: 4.5 mmol/L (ref 3.5–5.3)
Sodium: 137 mmol/L (ref 135–146)
Total Protein: 7.7 g/dL (ref 6.1–8.1)
eGFR: 107 mL/min/{1.73_m2} (ref >=60)

## 2023-03-20 LAB — CBC
HCT: 41.9 % (ref 38.5–50.0)
Hemoglobin: 13.4 g/dL (ref 13.2–17.1)
MCH: 27 pg (ref 27.0–33.0)
MCV: 84.3 fL (ref 80.0–100.0)
MPV: 11.2 fL (ref 7.5–12.5)
Platelets: 309 10*3/uL (ref 140–400)
RBC: 4.97 10*6/uL (ref 4.20–5.80)

## 2023-03-20 LAB — HEMOGLOBIN A1C
Hgb A1c MFr Bld: 6.3 % of total Hgb — ABNORMAL HIGH (ref <5.7)
Mean Plasma Glucose: 134 mg/dL

## 2023-03-20 LAB — HIV-1 RNA QUANT-NO REFLEX-BLD
HIV 1 RNA Quant: NOT DETECTED Copies/mL
HIV-1 RNA Quant, Log: NOT DETECTED Log cps/mL

## 2023-03-30 DIAGNOSIS — Z811 Family history of alcohol abuse and dependence: Secondary | ICD-10-CM | POA: Diagnosis not present

## 2023-03-30 DIAGNOSIS — K5900 Constipation, unspecified: Secondary | ICD-10-CM | POA: Diagnosis not present

## 2023-03-30 DIAGNOSIS — E1142 Type 2 diabetes mellitus with diabetic polyneuropathy: Secondary | ICD-10-CM | POA: Diagnosis not present

## 2023-03-30 DIAGNOSIS — F1021 Alcohol dependence, in remission: Secondary | ICD-10-CM | POA: Diagnosis not present

## 2023-03-30 DIAGNOSIS — Z6841 Body Mass Index (BMI) 40.0 and over, adult: Secondary | ICD-10-CM | POA: Diagnosis not present

## 2023-03-30 DIAGNOSIS — Z21 Asymptomatic human immunodeficiency virus [HIV] infection status: Secondary | ICD-10-CM | POA: Diagnosis not present

## 2023-03-30 DIAGNOSIS — I10 Essential (primary) hypertension: Secondary | ICD-10-CM | POA: Diagnosis not present

## 2023-03-30 DIAGNOSIS — Z87891 Personal history of nicotine dependence: Secondary | ICD-10-CM | POA: Diagnosis not present

## 2023-03-30 DIAGNOSIS — M545 Low back pain, unspecified: Secondary | ICD-10-CM | POA: Diagnosis not present

## 2023-03-30 DIAGNOSIS — Z833 Family history of diabetes mellitus: Secondary | ICD-10-CM | POA: Diagnosis not present

## 2023-05-21 ENCOUNTER — Telehealth: Payer: Self-pay

## 2023-05-21 DIAGNOSIS — G8929 Other chronic pain: Secondary | ICD-10-CM

## 2023-05-21 MED ORDER — OXYCODONE HCL 5 MG PO TABS: 5.0000 mg | ORAL_TABLET | Freq: Two times a day (BID) | ORAL | 0 refills | Status: DC

## 2023-05-21 NOTE — Telephone Encounter (Signed)
Patient came in to request a Referral sent to [facility] [address] [place] [address]. Phone number [phone] Fax number [phone], was told he can only be seen if he has a referral by his provider and until he gets an appointment he will need medication refill for Oxycodone for pain to the [facility] in [place], Joanne Gavel has sent it before and only has 3 pills left. Patient best contact number is [phone], patient would like a call once medication and referral as been sent.

## 2023-05-21 NOTE — Telephone Encounter (Signed)
Received request for refill. Reviewed [place] Control Substance Database with no irregularities. Will refill prescription.  Marcos Eke, NP 05/21/2023 3:30 PM

## 2023-05-21 NOTE — Addendum Note (Signed)
Addended by: Linna Hoff D on: 05/21/2023 02:33 PM   Modules accepted: Orders

## 2023-05-21 NOTE — Telephone Encounter (Signed)
Spoke with Roberto Woodard, notified him that refill has been sent and referral faxed.   Call transferred to referral coordinator to discuss referral further.   Sandie Ano, RN

## 2023-05-21 NOTE — Addendum Note (Signed)
Addended by: Jeanine Luz D on: 05/21/2023 03:30 PM   Modules accepted: Orders

## 2023-06-30 ENCOUNTER — Telehealth: Payer: Self-pay

## 2023-06-30 DIAGNOSIS — G8929 Other chronic pain: Secondary | ICD-10-CM

## 2023-06-30 NOTE — Addendum Note (Signed)
Addended by: Blanchard Kelch on: 06/30/2023 01:09 PM   Modules accepted: Orders

## 2023-06-30 NOTE — Telephone Encounter (Signed)
Will place referral for [facility] off [facility]  [address] [place], [place] [address] Phone: [phone] Fax: [phone]   I don't know of anything else closer to him in [place] but we can't manage this long term for him.

## 2023-06-30 NOTE — Telephone Encounter (Signed)
Patient aware of referral to pain management. Front desk will reach out to patient to schedule sooner appointment due to patient not being seen x 3 months. Patient will need office appointment for refill on pain medication per [place],NP.   Sweden Lesure Lesli Albee, CMA

## 2023-06-30 NOTE — Telephone Encounter (Signed)
Patient came in requesting medication refill for Oxycodone to [facility] in [place], [place], patient was referred out for  but was told they would not see him. If he could get a phone call if refill was sent at [phone].

## 2023-07-02 ENCOUNTER — Encounter: Payer: Self-pay | Admitting: Infectious Diseases

## 2023-07-02 ENCOUNTER — Ambulatory Visit (INDEPENDENT_AMBULATORY_CARE_PROVIDER_SITE_OTHER): Payer: 59 | Admitting: Infectious Diseases

## 2023-07-02 ENCOUNTER — Other Ambulatory Visit: Payer: Self-pay

## 2023-07-02 VITALS — BP 146/83 | HR 105 | Resp 16 | Ht 66.0 in | Wt 246.0 lb

## 2023-07-02 DIAGNOSIS — G8929 Other chronic pain: Secondary | ICD-10-CM | POA: Diagnosis not present

## 2023-07-02 DIAGNOSIS — Z21 Asymptomatic human immunodeficiency virus [HIV] infection status: Secondary | ICD-10-CM

## 2023-07-02 DIAGNOSIS — R062 Wheezing: Secondary | ICD-10-CM | POA: Insufficient documentation

## 2023-07-02 DIAGNOSIS — F1190 Opioid use, unspecified, uncomplicated: Secondary | ICD-10-CM

## 2023-07-02 MED ORDER — ALBUTEROL SULFATE HFA 108 (90 BASE) MCG/ACT IN AERS
2.0000 | INHALATION_SPRAY | Freq: Four times a day (QID) | RESPIRATORY_TRACT | 2 refills | Status: DC | PRN
Start: 2023-07-02 — End: 2023-11-25

## 2023-07-02 MED ORDER — CETIRIZINE HCL 10 MG PO TABS
10.0000 mg | ORAL_TABLET | Freq: Every day | ORAL | 11 refills | Status: DC
Start: 2023-07-02 — End: 2024-06-29

## 2023-07-02 MED ORDER — OXYCODONE HCL 5 MG PO TABS
5.0000 mg | ORAL_TABLET | Freq: Two times a day (BID) | ORAL | 0 refills | Status: DC
Start: 2023-07-02 — End: 2023-09-30

## 2023-07-02 NOTE — Assessment & Plan Note (Deleted)
F/U in 3 m

## 2023-07-02 NOTE — Assessment & Plan Note (Signed)
Referral to pain clinic discussed again - I asked him to call his policy holder to see who is in network near him - will see if our referral coordinator can do the same to help.   [facility] - Dr. Elenora Fender, DO Phone the hospital at [phone] [address] [place], [place] [address]

## 2023-07-02 NOTE — Assessment & Plan Note (Signed)
[place] reviewed and no abnormalities seen. 90d refill provided for ongoing maintenance of chronic stable Rx.

## 2023-07-02 NOTE — Patient Instructions (Addendum)
I will put a referral in for you here - I would call them to see if they take your insurance.  If they do not - call your insurance to see if they can help figure out who is in network for you.   [facility] - Dr. Elenora Fender, DO Phone the hospital at [phone] [address] [place], [place] [address]

## 2023-07-02 NOTE — Assessment & Plan Note (Signed)
Sounds like most of the kids in his house have shared a URI and he probably did as well. No h/o asthma.  I do suspect some allergic mediated symptoms with itchy eyes / throat. Start daily allergy medication OTC and albuterol inhaler to help with wheezing. If viral should continue to improve slowly over time.

## 2023-07-02 NOTE — Progress Notes (Signed)
Name: Roberto Woodard  DOB: 02/12/1971 MRN: [id number] PCP: Oval Linsey, MD (Inactive)    Brief Narrative:  Roberto Woodard is a 52 y.o. male with HIV infection, non-AIDS; Dx 2005 with CD4 nadir unknown (never on OI proph). HLA B*5701 (+).  HIV Risk: heterosexual.  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   Previous Regimens: Reyataz / Noravir / Truvada --> suppressed 05/2017: Descovy / Prezcobix --> suppressed (switched to reduce hyperglycemia a/w cobi) Biktarvy 2018 --> suppressed    Genotypes: Records not availible      Subjective:   Chief Complaint  Patient presents with   Follow-up      HPI: Roberto Woodard is here for a work in visit to discuss refills of chronic percocet 5/325 BID for chronic neck/back pain. We have tried to help with referrals 3 times now and are having a tough time with his insurance evidently from what he tells me.   He is out of his pain medication and his pain has really been hard to handle  He also has had a cough for about a month, mostly dry but feels like he has noise in his chest. Typically gets itchy throat and nose as well. No fevers/chills but a little drug down. Takes benadryl at night sometimes.    Review of Systems  HENT:  Positive for congestion, sneezing and sore throat.   Eyes:  Positive for itching.  Respiratory:  Positive for cough and wheezing. Negative for chest tightness and shortness of breath.   Musculoskeletal:  Positive for back pain and neck pain.  All other systems reviewed and are negative.    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 30 tablet 11   lisinopril (ZESTRIL) 20 MG tablet Take 1 tablet (20 mg total) by mouth daily. 30 tablet 11   PARoxetine (PAXIL) 20 MG tablet TAKE 1 TABLET(20 MG) BY MOUTH DAILY 30 tablet 11   rosuvastatin (CRESTOR) 10 MG tablet Take 1 tablet (10 mg total) by mouth daily. 30 tablet 11   oxyCODONE (OXY IR/ROXICODONE) 5 MG immediate release tablet Take 1 tablet (5 mg total) by mouth every 12 (twelve) hours. (Patient not taking: Reported on 07/02/2023) 60 tablet 0   No facility-administered medications prior to visit.     Allergies  Allergen Reactions   Abacavir Anaphylaxis    Positive HLA B*5701   Metformin Swelling   Other     Sweet Peppers and arugala-- Hives and angioedema.     Social History   Tobacco Use   Smoking status: Former    Types: Cigarettes   Smokeless tobacco: Never  Substance Use Topics   Alcohol use: Yes    Comment: wine occasionally    Drug use: Yes    Types: Marijuana    Comment: twice a day     Social History   Substance and Sexual Activity  Sexual Activity Yes   Partners: Female   Birth control/protection: Condom   Comment: declined condoms    Objective:  Vitals:   07/02/23 1540  BP: (!) 146/83  Pulse: (!) 105  Resp: 16  Weight: 246 lb (111.6 kg)  Height: 5\' 6"  (1.676 m)   Body mass index is 39.71 kg/m.   Physical Exam Vitals reviewed.  Constitutional:      Appearance: He is well-developed.     Comments: Seated comfortably in chair during visit.   HENT:     Nose: Nose normal.     Mouth/Throat:     Dentition: Normal dentition. No dental abscesses.     Pharynx: Oropharynx is clear.  Eyes:     Comments: Erythematous sclera   Cardiovascular:     Rate and Rhythm: Normal rate and regular rhythm.     Heart sounds: Normal heart sounds.  Pulmonary:     Effort: Pulmonary effort is normal.     Breath sounds:  Wheezing (expiratory heard in b/l lower fields posteriorally) present.  Abdominal:     General: There is no distension.     Palpations: Abdomen is soft.     Tenderness: There is no abdominal tenderness.  Lymphadenopathy:     Cervical: No cervical adenopathy.  Skin:    General: Skin is warm and dry.     Findings: No rash.  Neurological:     Mental Status: He is alert and oriented to person, place, and time.  Psychiatric:        Judgment: Judgment normal.     Comments: In good spirits today and engaged in care discussion.      Lab Results Lab Results  Component Value Date   WBC 5.1 03/17/2023   HGB 13.4 03/17/2023   HCT 41.9 03/17/2023   MCV 84.3 03/17/2023   PLT 309 03/17/2023    Lab Results  Component Value Date   CREATININE 0.84 03/17/2023   BUN 16 03/17/2023   NA 137 03/17/2023   K 4.5 03/17/2023   CL 101 03/17/2023   CO2 25 03/17/2023    Lab Results  Component Value Date   ALT 69 (H) 03/17/2023   AST 43 (H) 03/17/2023   ALKPHOS 97 05/18/2017   BILITOT 0.5 03/17/2023    Lab Results  Component Value Date   CHOL 241 (H) 03/17/2023   HDL 61 03/17/2023   LDLCALC 156 (H) 03/17/2023   TRIG 120 03/17/2023   CHOLHDL 4.0 03/17/2023   HIV 1 RNA Quant  Date Value  03/17/2023 Not Detected Copies/mL  09/23/2022 <20 Copies/mL (H)  04/08/2022 <20 DETECTED copies/mL (A)   CD4 T Cell Abs (/uL)  Date Value  03/17/2023 368 (L)  09/23/2022 433  04/08/2022 418     Assessment & Plan:   Problem List Items Addressed This Visit       High   HIV (human immunodeficiency virus infection) (HCC) (Chronic)     Unprioritized   Chronic pain - Primary    Referral to pain clinic discussed again - I asked him to call his policy holder to see who is in network near him - will see if our referral coordinator can do the same to help.   [facility] - Dr. Elenora Fender, DO Phone the hospital at [phone] [address] [place], [place] [address]       Relevant Medications   oxyCODONE (OXY IR/ROXICODONE) 5 MG immediate release tablet   Other Relevant Orders   Ambulatory referral to Pain Clinic   Chronic, continuous use of opioids    [place]CRS reviewed and no abnormalities seen. 90d refill  provided for ongoing maintenance of chronic stable Rx.       Relevant Medications   oxyCODONE (OXY IR/ROXICODONE) 5 MG immediate release tablet   Other Relevant Orders   DRUG MONITORING, PANEL 8 WITH CONFIRMATION, URINE   Wheezing on both sides of chest    Sounds like most of the kids in his house have shared a URI and he probably did as well. No h/o asthma.  I do suspect some allergic mediated symptoms with itchy eyes / throat. Start daily allergy medication OTC and albuterol inhaler to help with wheezing. If viral should continue to improve slowly over time.       Relevant Medications   cetirizine (ZYRTEC) 10 MG tablet   albuterol (VENTOLIN HFA) 108 (90 Base) MCG/ACT inhaler   Rexene Alberts, MSN, NP-C [facility] for Infectious Disease [facility] Pager: [phone] Office: [phone]  07/02/23

## 2023-07-05 LAB — DRUG MONITORING, PANEL 8 WITH CONFIRMATION, URINE
6 Acetylmorphine: NEGATIVE ng/mL (ref <10)
Alcohol Metabolites: POSITIVE ng/mL — AB (ref <500)
Amphetamines: NEGATIVE ng/mL (ref <500)
Benzodiazepines: NEGATIVE ng/mL (ref <100)
Buprenorphine, Urine: NEGATIVE ng/mL (ref <5)
Cocaine Metabolite: NEGATIVE ng/mL (ref <150)
Codeine: NEGATIVE ng/mL (ref <50)
Creatinine: 216.5 mg/dL (ref >=20.0)
Ethyl Glucuronide (ETG): >100000 ng/mL — ABNORMAL HIGH (ref <500)
Ethyl Sulfate (ETS): 53526 ng/mL — ABNORMAL HIGH (ref <100)
Hydrocodone: NEGATIVE ng/mL (ref <50)
Hydromorphone: NEGATIVE ng/mL (ref <50)
MDMA: NEGATIVE ng/mL (ref <500)
Marijuana Metabolite: NEGATIVE ng/mL (ref <20)
Morphine: NEGATIVE ng/mL (ref <50)
Norhydrocodone: NEGATIVE ng/mL (ref <50)
Noroxycodone: 1128 ng/mL — ABNORMAL HIGH (ref <50)
Opiates: NEGATIVE ng/mL (ref <100)
Oxidant: NEGATIVE ug/mL (ref <200)
Oxycodone: 705 ng/mL — ABNORMAL HIGH (ref <50)
Oxycodone: POSITIVE ng/mL — AB (ref <100)
Oxymorphone: 158 ng/mL — ABNORMAL HIGH (ref <50)
pH: 5.7 (ref 4.5–9.0)

## 2023-07-05 LAB — DM TEMPLATE

## 2023-07-06 ENCOUNTER — Ambulatory Visit: Payer: 59 | Admitting: Infectious Diseases

## 2023-08-07 ENCOUNTER — Other Ambulatory Visit: Payer: Self-pay | Admitting: Infectious Diseases

## 2023-08-07 DIAGNOSIS — F32A Depression, unspecified: Secondary | ICD-10-CM

## 2023-08-07 DIAGNOSIS — I10 Essential (primary) hypertension: Secondary | ICD-10-CM

## 2023-08-10 NOTE — Telephone Encounter (Signed)
Please advise on refill.

## 2023-09-21 ENCOUNTER — Ambulatory Visit: Payer: 59 | Admitting: Infectious Diseases

## 2023-09-25 ENCOUNTER — Other Ambulatory Visit: Payer: Self-pay | Admitting: Infectious Diseases

## 2023-09-25 DIAGNOSIS — Z21 Asymptomatic human immunodeficiency virus [HIV] infection status: Secondary | ICD-10-CM

## 2023-09-28 ENCOUNTER — Ambulatory Visit: Payer: 59 | Admitting: Infectious Diseases

## 2023-09-30 ENCOUNTER — Ambulatory Visit (INDEPENDENT_AMBULATORY_CARE_PROVIDER_SITE_OTHER): Payer: 59 | Admitting: Infectious Diseases

## 2023-09-30 ENCOUNTER — Other Ambulatory Visit: Payer: Self-pay

## 2023-09-30 VITALS — BP 132/79 | HR 85 | Temp 98.0°F | Resp 16 | Wt 245.0 lb

## 2023-09-30 DIAGNOSIS — Z23 Encounter for immunization: Secondary | ICD-10-CM

## 2023-09-30 DIAGNOSIS — E785 Hyperlipidemia, unspecified: Secondary | ICD-10-CM | POA: Diagnosis not present

## 2023-09-30 DIAGNOSIS — G8929 Other chronic pain: Secondary | ICD-10-CM | POA: Diagnosis not present

## 2023-09-30 DIAGNOSIS — F1190 Opioid use, unspecified, uncomplicated: Secondary | ICD-10-CM

## 2023-09-30 DIAGNOSIS — E7800 Pure hypercholesterolemia, unspecified: Secondary | ICD-10-CM

## 2023-09-30 DIAGNOSIS — B20 Human immunodeficiency virus [HIV] disease: Secondary | ICD-10-CM

## 2023-09-30 DIAGNOSIS — Z79891 Long term (current) use of opiate analgesic: Secondary | ICD-10-CM | POA: Diagnosis not present

## 2023-09-30 DIAGNOSIS — Z21 Asymptomatic human immunodeficiency virus [HIV] infection status: Secondary | ICD-10-CM

## 2023-09-30 MED ORDER — ROSUVASTATIN CALCIUM 20 MG PO TABS
20.0000 mg | ORAL_TABLET | Freq: Every day | ORAL | 11 refills | Status: DC
Start: 2023-09-30 — End: 2023-11-25

## 2023-09-30 MED ORDER — OXYCODONE HCL 5 MG PO TABS: 5.0000 mg | ORAL_TABLET | Freq: Two times a day (BID) | ORAL | 0 refills | Status: DC

## 2023-09-30 NOTE — Patient Instructions (Addendum)
INCREASE the crestor (cholesterol pill) to 20 mg once daily.   You should have refills on everything else until your next appointment with me.

## 2023-09-30 NOTE — Progress Notes (Unsigned)
Name: Roberto Woodard  DOB: May 23, 1971 MRN: [id number] PCP: Oval Linsey, MD (Inactive)    Brief Narrative:  Roberto Woodard is a 52 y.o. male with HIV infection, non-AIDS; Dx 2005 with CD4 nadir unknown (never on OI proph). HLA B*5701 (+).  HIV Risk: heterosexual.  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   Previous Regimens: Reyataz / Noravir / Truvada --> suppressed 05/2017: Descovy / Prezcobix --> suppressed (switched to reduce hyperglycemia a/w cobi) Biktarvy 2018 --> suppressed    Genotypes: Records not availible      Subjective:   Chief Complaint  Patient presents with   Follow-up    B20      Discussed the use of AI scribe software for clinical note transcription with the patient, who gave verbal consent to proceed.  History of Present Illness   The patient, with a history of chronic pain, pre-diabetes, and high cholesterol, presents with concerns about his current medication regimen. He reports a significant reliance on oxycodone for managing persistent lower back pain, which he describes as severe and constant. He has had a hard time finding a pain doctor that will take insurance.   The patient admits to a history of alcohol consumption, which he reports has been significantly reduced in recent months. He used to drink wine regularly with meals, but found himself wanting more over time. He reports having stopped this habit about two months ago.  The patient also reports a significant weight gain of 15 pounds, which he attributes to an increased appetite and enjoyment of food. He expresses frustration with this weight gain, despite maintaining an active lifestyle with regular work and physical activity.  Interestingly, the patient reports a new intolerance to beef following his COVID-19 vaccination. He describes severe reactions, including significant swelling and itchiness, following consumption of beef. This reaction is  severe enough to have led him to completely avoid beef in his diet.  The patient is currently on a regimen of Paxil, Lisinopril, and cholesterol medication. He reports that the Paxil is effective in managing his mood and he has no desire to change this medication. However, he has had an allergic reaction to Metformin in the past, which manifested as itchiness and diarrhea. He also reports that his cholesterol levels have been higher than desired, despite medication.      Review of Systems  Constitutional:  Positive for unexpected weight change.  Musculoskeletal:  Positive for back pain.  All other systems reviewed and are negative.    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 8 g 2   amLODipine (NORVASC) 10 MG tablet TAKE 1 TABLET(10 MG) BY MOUTH DAILY 30 tablet 11   BIKTARVY 50-200-25 MG TABS tablet TAKE 1 TABLET BY MOUTH DAILY 30 tablet 5   lisinopril (ZESTRIL) 20 MG tablet TAKE 1 TABLET(20 MG) BY MOUTH DAILY 30 tablet 11   oxyCODONE (OXY IR/ROXICODONE) 5 MG immediate release tablet Take 1 tablet (5 mg total) by mouth every 12 (twelve) hours. 180 tablet 0   PARoxetine (PAXIL) 20  MG tablet TAKE 1 TABLET(20 MG) BY MOUTH DAILY 30 tablet 11   rosuvastatin (CRESTOR) 10 MG tablet Take 1 tablet (10 mg total) by mouth daily. 30 tablet 11   cetirizine (ZYRTEC) 10 MG tablet Take 1 tablet (10 mg total) by mouth daily. (Patient not taking: Reported on 09/30/2023) 30 tablet 11   No facility-administered medications prior to visit.     Allergies  Allergen Reactions    Abacavir Anaphylaxis    Positive HLA B*5701   Metformin Swelling   Other     Sweet Peppers and arugala-- Hives and angioedema.     Social History   Tobacco Use   Smoking status: Former    Types: Cigarettes   Smokeless tobacco: Never  Substance Use Topics   Alcohol use: Yes    Comment: wine occasionally    Drug use: Yes    Types: Marijuana    Comment: twice a day     Social History   Substance and Sexual Activity  Sexual Activity Yes   Partners: Female   Birth control/protection: Condom   Comment: declined condoms    Objective:   Vitals:   09/30/23 1513  BP: 132/79  Pulse: 85  Resp: 16  Temp: 98 F (36.7 C)  TempSrc: Temporal  SpO2: 97%  Weight: 245 lb (111.1 kg)   Body mass index is 39.54 kg/m.   Physical Exam Constitutional:      Appearance: Normal appearance. He is not ill-appearing.  HENT:     Head: Normocephalic.     Mouth/Throat:     Mouth: Mucous membranes are moist.     Pharynx: Oropharynx is clear.  Eyes:     General: No scleral icterus. Cardiovascular:     Rate and Rhythm: Normal rate and regular rhythm.  Pulmonary:     Effort: Pulmonary effort is normal.  Musculoskeletal:        General: Normal range of motion.     Cervical back: Normal range of motion.  Skin:    Coloration: Skin is not jaundiced or pale.  Neurological:     Mental Status: He is alert and oriented to person, place, and time.  Psychiatric:        Mood and Affect: Mood normal.        Judgment: Judgment normal.     Lab Results Lab Results  Component Value Date   WBC 5.1 03/17/2023   HGB 13.4 03/17/2023   HCT 41.9 03/17/2023   MCV 84.3 03/17/2023   PLT 309 03/17/2023    Lab Results  Component Value Date   CREATININE 0.84 03/17/2023   BUN 16 03/17/2023   NA 137 03/17/2023   K 4.5 03/17/2023   CL 101 03/17/2023   CO2 25 03/17/2023    Lab Results  Component Value Date   ALT 69 (H) 03/17/2023   AST 43 (H) 03/17/2023   ALKPHOS 97 05/18/2017   BILITOT 0.5  03/17/2023    Lab Results  Component Value Date   CHOL 241 (H) 03/17/2023   HDL 61 03/17/2023   LDLCALC 156 (H) 03/17/2023   TRIG 120 03/17/2023   CHOLHDL 4.0 03/17/2023   HIV 1 RNA Quant  Date Value  03/17/2023 Not Detected Copies/mL  09/23/2022 <20 Copies/mL (H)  04/08/2022 <20 DETECTED copies/mL (A)   CD4 T Cell Abs (/uL)  Date Value  03/17/2023 368 (L)  09/23/2022 433  04/08/2022 418     Assessment & Plan:   Problem List Items Addressed This Visit  High   HIV (human immunodeficiency virus infection) (HCC) (Chronic)   Other Visit Diagnoses     Encounter for immunization    -  Primary   Relevant Orders   Flu vaccine trivalent PF, 6mos and older(Flulaval,Afluria,Fluarix,Fluzone) (Completed)   Tdap vaccine greater than or equal to 7yo IM (Completed)         HIV -  Very well controlled on once daily Biktarvy.  -Continue Biktarvy  -VL / CD4 Q6-12m  -Vaccines up to date  -Address other cardiometabolic risk factors as mentioned below   Alcohol Use  High alcohol levels noted on previous lab work. We discussed this together today. Patient reports cessation of alcohol use for the past two months. -Encouraged continued abstinence from alcohol use. -Counseled about the significant risks with opioids and alcohol mixing.   Chronic Pain Reports ongoing low back pain managed with oxycodone. -Continue current regimen of oxycodone 5 mg BID  -NCCRS website manually reviewed, and no concerns noted  -Urine drug panel ordered today  Hyperlipidemia Recent labs showed elevated cholesterol levels. -Increase dose of Crestor 20 mg daily  -Plan to recheck cholesterol levels in 3 months.  Prediabetes Patient has a history of prediabetes. -Continue current management plan. -If access to insurance, may be able to use alternative option for insulin resistance - GLP1?   Possible Food Allergy Patient reports severe reaction to beef consumption since receiving COVID-19  vaccine. -Plan to order alpha gal next blood draw  General Health Maintenance -Continue current medications including Paxil and Biktarvy. -Plan to follow up in 3 months.      Return in about 3 months (around 12/31/2023).  Rexene Alberts, MSN, NP-C [facility] for Infectious Disease [facility] Pager: [phone] Office: [phone]  09/30/23

## 2023-10-01 ENCOUNTER — Telehealth: Payer: Self-pay | Admitting: Infectious Diseases

## 2023-10-01 MED ORDER — OXYCODONE HCL 5 MG PO TABS: 5.0000 mg | ORAL_TABLET | Freq: Two times a day (BID) | ORAL | 0 refills | Status: AC

## 2023-10-01 NOTE — Telephone Encounter (Signed)
Spoke with [facility] and canceled Rx for oxycodone 5 mg. Confirmed patient had not picked up.   Provider sent refills to [facility]. Called [facility] and let him know.   Sandie Ano, RN

## 2023-10-01 NOTE — Telephone Encounter (Signed)
Roberto Woodard called hoping to get a refill for 5mg  Oxycodone sent to [facility] in [place]. He asks to be contacted when the request is sent so he can contact the pharmacy. He can be reached at [phone].

## 2023-10-02 ENCOUNTER — Telehealth: Payer: Self-pay

## 2023-10-02 LAB — DRUG MONITORING, PANEL 8 WITH CONFIRMATION, URINE
6 Acetylmorphine: NEGATIVE ng/mL (ref <10)
Alcohol Metabolites: NEGATIVE ng/mL (ref <500)
Amphetamines: NEGATIVE ng/mL (ref <500)
Benzodiazepines: NEGATIVE ng/mL (ref <100)
Buprenorphine, Urine: NEGATIVE ng/mL (ref <5)
Cocaine Metabolite: NEGATIVE ng/mL (ref <150)
Codeine: NEGATIVE ng/mL (ref <50)
Creatinine: 134.5 mg/dL (ref >=20.0)
Hydrocodone: NEGATIVE ng/mL (ref <50)
Hydromorphone: NEGATIVE ng/mL (ref <50)
MDMA: NEGATIVE ng/mL (ref <500)
Marijuana Metabolite: NEGATIVE ng/mL (ref <20)
Morphine: NEGATIVE ng/mL (ref <50)
Norhydrocodone: NEGATIVE ng/mL (ref <50)
Noroxycodone: 1058 ng/mL — ABNORMAL HIGH (ref <50)
Opiates: NEGATIVE ng/mL (ref <100)
Oxidant: NEGATIVE ug/mL (ref <200)
Oxycodone: 507 ng/mL — ABNORMAL HIGH (ref <50)
Oxycodone: POSITIVE ng/mL — AB (ref <100)
Oxymorphone: 117 ng/mL — ABNORMAL HIGH (ref <50)
pH: 6.9 (ref 4.5–9.0)

## 2023-10-02 LAB — DM TEMPLATE

## 2023-10-02 NOTE — Telephone Encounter (Signed)
PA for oxycodone 5mg  initiated via covermymeds, awaiting response.   Key: [id number]  Sandie Ano, RN

## 2023-10-12 NOTE — Telephone Encounter (Signed)
PA approved 10/07/23-04/05/24 Approval faxed to pharmacy [facility] Jonathon Resides, CMA

## 2023-11-25 ENCOUNTER — Other Ambulatory Visit: Payer: Self-pay

## 2023-11-25 ENCOUNTER — Other Ambulatory Visit: Payer: Self-pay | Admitting: Infectious Diseases

## 2023-11-25 ENCOUNTER — Other Ambulatory Visit: Payer: Self-pay | Admitting: Pharmacist

## 2023-11-25 ENCOUNTER — Other Ambulatory Visit (HOSPITAL_COMMUNITY): Payer: Self-pay

## 2023-11-25 DIAGNOSIS — G8929 Other chronic pain: Secondary | ICD-10-CM

## 2023-11-25 DIAGNOSIS — E7800 Pure hypercholesterolemia, unspecified: Secondary | ICD-10-CM

## 2023-11-25 DIAGNOSIS — I10 Essential (primary) hypertension: Secondary | ICD-10-CM

## 2023-11-25 DIAGNOSIS — F32A Depression, unspecified: Secondary | ICD-10-CM

## 2023-11-25 DIAGNOSIS — Z21 Asymptomatic human immunodeficiency virus [HIV] infection status: Secondary | ICD-10-CM

## 2023-11-25 DIAGNOSIS — R062 Wheezing: Secondary | ICD-10-CM

## 2023-11-25 DIAGNOSIS — F1190 Opioid use, unspecified, uncomplicated: Secondary | ICD-10-CM

## 2023-11-25 MED ORDER — LISINOPRIL 20 MG PO TABS
20.0000 mg | ORAL_TABLET | Freq: Every day | ORAL | 5 refills | Status: DC
  Filled 2023-11-25 – 2023-11-26 (×3): qty 30, 30d supply, fill #0
  Filled 2024-06-07 (×2): qty 30, 30d supply, fill #1
  Filled 2024-07-04: qty 30, 30d supply, fill #2
  Filled 2024-08-11: qty 30, 30d supply, fill #3
  Filled 2024-09-15: qty 30, 30d supply, fill #4
  Filled 2024-10-13: qty 30, 30d supply, fill #5

## 2023-11-25 MED ORDER — ALBUTEROL SULFATE HFA 108 (90 BASE) MCG/ACT IN AERS
2.0000 | INHALATION_SPRAY | Freq: Four times a day (QID) | RESPIRATORY_TRACT | 2 refills | Status: AC | PRN
  Filled 2023-11-25: qty 6.7, 25d supply, fill #0

## 2023-11-25 MED ORDER — ROSUVASTATIN CALCIUM 20 MG PO TABS
20.0000 mg | ORAL_TABLET | Freq: Every day | ORAL | 5 refills | Status: AC
  Filled 2023-11-25 – 2023-11-26 (×3): qty 30, 30d supply, fill #0
  Filled 2024-06-07 (×2): qty 30, 30d supply, fill #1
  Filled 2024-07-04: qty 30, 30d supply, fill #2
  Filled 2024-08-11: qty 30, 30d supply, fill #3
  Filled 2024-09-15: qty 30, 30d supply, fill #4

## 2023-11-25 MED ORDER — BIKTARVY 50-200-25 MG PO TABS
1.0000 | ORAL_TABLET | Freq: Every day | ORAL | 5 refills | Status: DC
Start: 2023-11-25 — End: 2024-05-05
  Filled 2023-11-25: qty 30, 30d supply, fill #0
  Filled 2023-12-14: qty 30, 30d supply, fill #1
  Filled 2024-01-19 (×2): qty 30, 30d supply, fill #2
  Filled 2024-02-10: qty 30, 30d supply, fill #3
  Filled 2024-03-08: qty 30, 30d supply, fill #4
  Filled 2024-04-07: qty 30, 30d supply, fill #5

## 2023-11-25 MED ORDER — AMLODIPINE BESYLATE 10 MG PO TABS
10.0000 mg | ORAL_TABLET | Freq: Every day | ORAL | 5 refills | Status: DC
  Filled 2023-11-25 – 2023-11-26 (×3): qty 30, 30d supply, fill #0
  Filled 2024-06-07 (×2): qty 30, 30d supply, fill #1
  Filled 2024-07-04: qty 30, 30d supply, fill #2
  Filled 2024-08-11: qty 30, 30d supply, fill #3
  Filled 2024-09-15: qty 30, 30d supply, fill #4
  Filled 2024-10-13: qty 30, 30d supply, fill #5

## 2023-11-25 MED ORDER — PAROXETINE HCL 20 MG PO TABS
20.0000 mg | ORAL_TABLET | Freq: Every day | ORAL | 5 refills | Status: DC
  Filled 2023-11-25 – 2023-11-26 (×3): qty 30, 30d supply, fill #0
  Filled 2024-06-07 (×2): qty 30, 30d supply, fill #1
  Filled 2024-07-04: qty 30, 30d supply, fill #2
  Filled 2024-08-11: qty 30, 30d supply, fill #3
  Filled 2024-09-15: qty 30, 30d supply, fill #4
  Filled 2024-10-13: qty 30, 30d supply, fill #5

## 2023-11-25 NOTE — Progress Notes (Signed)
 Specialty Pharmacy Initiation Note   Roberto Woodard is a 53 y.o. male who will be followed by the specialty pharmacy service for RxSp HIV    Review of administration, indication, effectiveness, safety, potential side effects, storage/disposable, and missed dose instructions occurred today for patient's specialty medication(s) Bictegravir-Emtricitab-Tenofov (Biktarvy )     Patient/Caregiver did not have any additional questions or concerns.   Patient's therapy is appropriate to: Continue    Goals Addressed             This Visit's Progress    Achieve Undetectable HIV Viral Load < 20       Patient is on track. Patient will maintain adherence      Comply with lab assessments       Patient is on track. Patient will maintain adherence      Increase CD4 count until steady state       Patient is on track. Patient will maintain adherence         Sonya Duster Specialty Pharmacist

## 2023-11-25 NOTE — Progress Notes (Signed)
 Specialty Pharmacy Initial Fill Coordination Note  Roberto Woodard is a 53 y.o. male contacted today regarding initial fill of specialty medication(s) Bictegravir-Emtricitab-Tenofov (Biktarvy )   Patient requested Delivery   Delivery date: 11/27/23   Verified address: [address] [place] [address]   Medication will be filled on 11/26/23.   Patient is aware of $0 copayment.

## 2023-11-26 ENCOUNTER — Other Ambulatory Visit (HOSPITAL_BASED_OUTPATIENT_CLINIC_OR_DEPARTMENT_OTHER): Payer: Self-pay

## 2023-11-26 ENCOUNTER — Other Ambulatory Visit: Payer: Self-pay

## 2023-11-26 ENCOUNTER — Other Ambulatory Visit (HOSPITAL_COMMUNITY): Payer: Self-pay

## 2023-12-14 ENCOUNTER — Other Ambulatory Visit (HOSPITAL_COMMUNITY): Payer: Self-pay

## 2023-12-14 ENCOUNTER — Other Ambulatory Visit (HOSPITAL_COMMUNITY): Payer: Self-pay | Admitting: Pharmacy Technician

## 2023-12-14 NOTE — Progress Notes (Signed)
Specialty Pharmacy Refill Coordination Note  Roberto Woodard is a 53 y.o. male contacted today regarding refills of specialty medication(s) Bictegravir-Emtricitab-Tenofov Musician)   Patient requested Delivery   Delivery date: 12/24/23   Verified address: Patient address [address]  [place]   Medication will be filled on 12/23/23.

## 2023-12-23 ENCOUNTER — Other Ambulatory Visit: Payer: Self-pay

## 2024-01-05 ENCOUNTER — Telehealth: Payer: Self-pay

## 2024-01-05 DIAGNOSIS — Z758 Other problems related to medical facilities and other health care: Secondary | ICD-10-CM

## 2024-01-05 DIAGNOSIS — F1190 Opioid use, unspecified, uncomplicated: Secondary | ICD-10-CM

## 2024-01-05 MED ORDER — OXYCODONE HCL 5 MG PO TABS
5.0000 mg | ORAL_TABLET | Freq: Two times a day (BID) | ORAL | 0 refills | Status: DC
Start: 2024-01-05 — End: 2024-01-12

## 2024-01-05 NOTE — Addendum Note (Signed)
 Addended by: Blanchard Kelch on: 01/05/2024 03:23 PM   Modules accepted: Orders

## 2024-01-05 NOTE — Telephone Encounter (Signed)
 Patient came in to the office requesting a referral to [facility] and refill on Oxycodone be sent to [facility] in [place] until he is able to get in to [facility].

## 2024-01-05 NOTE — Telephone Encounter (Signed)
 Per d/w Roberto Woodard -  Regarding pain clinic referral: visit even with Medicaid would be 300 plus and said he couldn't do that at this moment. Told her that [facility] would only see him by referral and could be 2 -3 weeks before they can get him in after they receive that. Referral placed per his request for [facility] in [place].   I will send in 1 week to bridge him to appt with me and can take care of further refills to bridge to appt with family medicine.   [place]CRS website reviewed - no discrepancies.    Rexene Alberts, MSN, NP-C [facility] for Infectious Disease [facility]  [email or web address] Pager: [phone] Office: [phone] [facility] Main Line: [phone] *Secure Chat Communication Welcome

## 2024-01-12 ENCOUNTER — Other Ambulatory Visit: Payer: Self-pay

## 2024-01-12 ENCOUNTER — Ambulatory Visit (INDEPENDENT_AMBULATORY_CARE_PROVIDER_SITE_OTHER): Payer: 59 | Admitting: Infectious Diseases

## 2024-01-12 VITALS — Wt 253.0 lb

## 2024-01-12 DIAGNOSIS — F1190 Opioid use, unspecified, uncomplicated: Secondary | ICD-10-CM | POA: Diagnosis not present

## 2024-01-12 MED ORDER — OXYCODONE HCL 5 MG PO TABS: 5.0000 mg | ORAL_TABLET | Freq: Two times a day (BID) | ORAL | 0 refills | Status: DC

## 2024-01-12 NOTE — Progress Notes (Signed)
 Name: Roberto Woodard  DOB: 1971-10-12 MRN: [id number] PCP: Oval Linsey, MD (Inactive)    Brief Narrative:  Roberto Woodard is a 53 y.o. male with HIV infection, non-AIDS; Dx 2005 with CD4 nadir unknown (never on OI proph). HLA B*5701 (+).  HIV Risk: heterosexual.  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   Previous Regimens: Reyataz / Noravir / Truvada --> suppressed 05/2017: Descovy / Prezcobix --> suppressed (switched to reduce hyperglycemia a/w cobi) Biktarvy 2018 --> suppressed    Genotypes: Records not availible      Subjective:   Chief Complaint  Patient presents with   Follow-up    Discuss pain management referral      Discussed the use of AI scribe software for clinical note transcription with the patient, who gave verbal consent to proceed.  History of Present Illness   Roberto Woodard is a 53 year old male who presents for follow up on chronic pain management. He also has acute eye irritation following a foreign body injury.  While working on his house, a small piece of material entered his eye, causing redness and a sensation of a foreign body. This occurred yesterday, and since then, he has experienced persistent irritation and tearing, particularly at night. The eye feels painful, with a sensation as if there is still a small piece of material in it. He attempted to wash the eye with saline, and his wife applied something to it last night, but the discomfort persisted throughout the night. No other symptoms are reported. He describes it to be improving. He does not use goggles due to them steaming up and becoming cloudy, which he feels makes it more risky.   He is managing chronic pain and is in the process of coordinating care with a pain management clinic. There was a misunderstanding regarding insurance coverage, but it has been clarified that the services are covered. He is awaiting confirmation of an appointment with  a nearby clinic for ongoing pain management.      Review of Systems  Constitutional:  Positive for unexpected weight change.  Musculoskeletal:  Positive for back pain.  All other systems reviewed and are negative.    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 6.7 g 2   amLODipine (NORVASC) 10 MG tablet Take 1 tablet (10 mg total) by mouth daily. 30 tablet 5   bictegravir-emtricitabine-tenofovir AF (BIKTARVY) 50-200-25 MG TABS tablet Take 1 tablet by mouth daily. 30 tablet 5   cetirizine (ZYRTEC) 10 MG tablet Take 1 tablet (10 mg total) by mouth daily. 30 tablet 11   lisinopril (ZESTRIL) 20 MG tablet Take 1 tablet (20 mg total) by mouth daily. 30 tablet 5   PARoxetine (PAXIL) 20 MG tablet Take 1 tablet (20 mg total) by mouth daily. 30 tablet 5   rosuvastatin (CRESTOR) 20 MG tablet Take 1 tablet (20 mg total) by mouth daily. 30 tablet 5   oxyCODONE (OXY IR/ROXICODONE) 5 MG immediate release tablet Take 1 tablet (5 mg total) by mouth 2 (two) times  daily for 7 days. 14 tablet 0   No facility-administered medications prior to visit.     Allergies  Allergen Reactions   Abacavir Anaphylaxis    Positive HLA B*5701   Metformin Swelling   Other     Sweet Peppers and arugala-- Hives and angioedema.     Social History   Tobacco Use   Smoking status: Former    Types: Cigarettes   Smokeless tobacco: Never  Substance Use Topics   Alcohol use: Yes    Comment: wine occasionally    Drug use: Yes    Types: Marijuana    Comment: twice a day     Social  History   Substance and Sexual Activity  Sexual Activity Yes   Partners: Female   Birth control/protection: Condom   Comment: declined condoms    Objective:   Vitals:   01/12/24 1457  Weight: 253 lb (114.8 kg)    Body mass index is 40.84 kg/m.   Physical Exam Constitutional:      Appearance: Normal appearance. He is not ill-appearing.  HENT:     Head: Normocephalic.     Mouth/Throat:     Mouth: Mucous membranes are moist.     Pharynx: Oropharynx is clear.  Eyes:     General: No scleral icterus. Cardiovascular:     Rate and Rhythm: Normal rate and regular rhythm.  Pulmonary:     Effort: Pulmonary effort is normal.  Musculoskeletal:        General: Normal range of motion.     Cervical back: Normal range of motion.  Skin:    Coloration: Skin is not jaundiced or pale.  Neurological:     Mental Status: He is alert and oriented to person, place, and time.  Psychiatric:        Mood and Affect: Mood normal.        Judgment: Judgment normal.     Lab Results Lab Results  Component Value Date   WBC 5.1 03/17/2023   HGB 13.4 03/17/2023   HCT 41.9 03/17/2023   MCV 84.3 03/17/2023   PLT 309 03/17/2023    Lab Results  Component Value Date   CREATININE 0.84 03/17/2023   BUN 16 03/17/2023   NA 137 03/17/2023   K 4.5 03/17/2023   CL 101 03/17/2023   CO2 25 03/17/2023    Lab Results  Component Value Date   ALT 69 (H) 03/17/2023   AST 43 (H) 03/17/2023   ALKPHOS 97 05/18/2017   BILITOT 0.5 03/17/2023    Lab Results  Component Value Date   CHOL 241 (H) 03/17/2023   HDL 61 03/17/2023   LDLCALC 156 (H) 03/17/2023   TRIG 120 03/17/2023   CHOLHDL 4.0 03/17/2023   HIV 1 RNA Quant  Date Value  03/17/2023 Not Detected Copies/mL  09/23/2022 <20 Copies/mL (H)  04/08/2022 <20 DETECTED copies/mL (A)   CD4 T Cell Abs (/uL)  Date Value  03/17/2023 368 (L)  09/23/2022 433  04/08/2022 418     Assessment & Plan:   Foreign body in eye - No foreign object  found; irritation likely due to sliver of metal or other substance.  - Advise saline irrigation to flush the eye.  Chronic pain management - Insurance coverage confirmed for pain management services. Awaiting clinic appointment confirmation. He has been provided with 2 contacts - [facility] at [phone] & [facility] Phone [phone] - Refill oxycocone +APAP BID for two months. -UDS today  - Instruct to  contact the pain management clinic to confirm appointment.      FU in 22m for routine HIV care   Rexene Alberts, MSN, NP-C [facility] for Infectious Disease [facility] Pager: [phone] Office: [phone]  01/12/24

## 2024-01-12 NOTE — Patient Instructions (Addendum)
 [facility] at [phone]   [facility]  Phone [phone]  Call me to let me know when you get an appointment please so I can make sure you are taken care of.

## 2024-01-14 LAB — DRUG MONITORING, PANEL 8 WITH CONFIRMATION, URINE
6 Acetylmorphine: NEGATIVE ng/mL (ref <10)
Alcohol Metabolites: POSITIVE ng/mL — AB (ref <500)
Amphetamines: NEGATIVE ng/mL (ref <500)
Benzodiazepines: NEGATIVE ng/mL (ref <100)
Buprenorphine, Urine: NEGATIVE ng/mL (ref <5)
Cocaine Metabolite: NEGATIVE ng/mL (ref <150)
Codeine: NEGATIVE ng/mL (ref <50)
Creatinine: 230.9 mg/dL (ref >=20.0)
Ethyl Glucuronide (ETG): 4390 ng/mL — ABNORMAL HIGH (ref <500)
Ethyl Sulfate (ETS): 684 ng/mL — ABNORMAL HIGH (ref <100)
Hydrocodone: NEGATIVE ng/mL (ref <50)
Hydromorphone: NEGATIVE ng/mL (ref <50)
MDMA: NEGATIVE ng/mL (ref <500)
Marijuana Metabolite: NEGATIVE ng/mL (ref <20)
Morphine: NEGATIVE ng/mL (ref <50)
Norhydrocodone: NEGATIVE ng/mL (ref <50)
Noroxycodone: 1230 ng/mL — ABNORMAL HIGH (ref <50)
Opiates: NEGATIVE ng/mL (ref <100)
Oxidant: NEGATIVE ug/mL (ref <200)
Oxycodone: 722 ng/mL — ABNORMAL HIGH (ref <50)
Oxycodone: POSITIVE ng/mL — AB (ref <100)
Oxymorphone: 706 ng/mL — ABNORMAL HIGH (ref <50)
pH: 5.6 (ref 4.5–9.0)

## 2024-01-14 LAB — DM TEMPLATE

## 2024-01-19 ENCOUNTER — Other Ambulatory Visit: Payer: Self-pay

## 2024-01-19 ENCOUNTER — Other Ambulatory Visit (HOSPITAL_COMMUNITY): Payer: Self-pay

## 2024-01-19 NOTE — Progress Notes (Signed)
 Specialty Pharmacy Refill Coordination Note  Roberto Woodard is a 53 y.o. male contacted today regarding refills of specialty medication(s) Bictegravir-Emtricitab-Tenofov Musician)   Patient requested Delivery   Delivery date: 01/21/24   Verified address: [address]   [place] [address]   Medication will be filled on 01/20/24.

## 2024-01-26 ENCOUNTER — Telehealth: Payer: Self-pay

## 2024-01-26 NOTE — Telephone Encounter (Signed)
 I spoke to patient and he states he went by pain clinic yesterday to follow up on an appointment and no one has called him back or scheduled him. He states he plans to go back tomorrow to follow up on an appointment.  Roberto Woodard, CMA

## 2024-01-26 NOTE — Telephone Encounter (Signed)
-----   Message from Hagerman sent at 01/26/2024  3:09 PM EDT ----- Can we call Roberto Woodard to see if he has made an appointment with new PCP / Pain Clinic yet?  I told him this was the last 2 months I would send him and he needed to start following with clinic formally.  Don't want any last minute phone calls or surprises from him.Marland KitchenMarland Kitchen

## 2024-02-10 ENCOUNTER — Other Ambulatory Visit: Payer: Self-pay

## 2024-02-10 ENCOUNTER — Other Ambulatory Visit: Payer: Self-pay | Admitting: Pharmacy Technician

## 2024-02-10 NOTE — Progress Notes (Signed)
 Specialty Pharmacy Refill Coordination Note  Roberto Woodard is a 53 y.o. male contacted today regarding refills of specialty medication(s) Bictegravir-Emtricitab-Tenofov Musician)   Patient requested Delivery   Delivery date: 02/17/24   Verified address: [address]  [place]   Medication will be filled on 02/16/24.

## 2024-02-16 ENCOUNTER — Other Ambulatory Visit: Payer: Self-pay

## 2024-03-08 ENCOUNTER — Other Ambulatory Visit (HOSPITAL_COMMUNITY): Payer: Self-pay

## 2024-03-08 ENCOUNTER — Other Ambulatory Visit: Payer: Self-pay

## 2024-03-08 NOTE — Progress Notes (Signed)
 Specialty Pharmacy Refill Coordination Note  Roberto Woodard is a 53 y.o. male contacted today regarding refills of specialty medication(s) Bictegravir-Emtricitab-Tenofov (Biktarvy )   Patient requested Delivery   Delivery date: 03/15/24   Verified address: [address]   [place] [address]   Medication will be filled on 03/14/24.

## 2024-03-09 ENCOUNTER — Telehealth: Payer: Self-pay

## 2024-03-09 MED ORDER — OXYCODONE HCL 5 MG PO TABS: 5.0000 mg | ORAL_TABLET | Freq: Two times a day (BID) | ORAL | 0 refills | Status: AC

## 2024-03-09 NOTE — Telephone Encounter (Signed)
 Patient came in to office requesting referral for pain management to be sent to [facility]. Said the [facility] didn't not accept him and has only 3 days left of medication. If he can get the referral resent to [place] with JAI KHOKHAL, SHRADDHA and a medicaiton refill sent until he can be seen. Best contact number is [phone]

## 2024-03-09 NOTE — Addendum Note (Signed)
 Addended by: Orson Blalock on: 03/09/2024 12:32 PM   Modules accepted: Orders

## 2024-03-14 ENCOUNTER — Other Ambulatory Visit: Payer: Self-pay

## 2024-03-22 DIAGNOSIS — M129 Arthropathy, unspecified: Secondary | ICD-10-CM | POA: Diagnosis not present

## 2024-03-29 NOTE — Progress Notes (Signed)
 The 10-year ASCVD risk score (Arnett DK, et al., 2019) is: 4.5%   Values used to calculate the score:     Age: 53 years     Sex: Male     Is Non-Hispanic African American: No     Diabetic: No     Tobacco smoker: No     Systolic Blood Pressure: 132 mmHg     Is BP treated: Yes     HDL Cholesterol: 61 mg/dL     Total Cholesterol: 241 mg/dL  Currently prescribed rosuvastatin  20 mg.  Hakan Nudelman, BSN, RN

## 2024-04-05 ENCOUNTER — Other Ambulatory Visit: Payer: Self-pay

## 2024-04-07 ENCOUNTER — Other Ambulatory Visit: Payer: Self-pay | Admitting: Pharmacy Technician

## 2024-04-07 ENCOUNTER — Other Ambulatory Visit: Payer: Self-pay

## 2024-04-07 NOTE — Progress Notes (Signed)
 Specialty Pharmacy Refill Coordination Note  Colonel Krauser is a 53 y.o. male contacted today regarding refills of specialty medication(s) Bictegravir-Emtricitab-Tenofov (Biktarvy )   Patient requested Delivery   Delivery date: 04/15/24   Verified address: [address]  [place]   Medication will be filled on 04/14/24.

## 2024-04-13 ENCOUNTER — Ambulatory Visit: Admitting: Infectious Diseases

## 2024-04-14 ENCOUNTER — Other Ambulatory Visit: Payer: Self-pay

## 2024-05-03 ENCOUNTER — Telehealth: Payer: Self-pay | Admitting: Infectious Diseases

## 2024-05-03 NOTE — Telephone Encounter (Signed)
 Spoke with patient regarding provider's message. Informed him that provider is not able to send in refills for pain meds and would need to follow up with pain management. Offered to give patient contact number to [facility], but patient said no thank you and ended call.  Lorenda CHRISTELLA Code, RMA

## 2024-05-03 NOTE — Telephone Encounter (Signed)
   Here is another option to look into that takes Medicaid from what I can tell on line. They manage pain and can see him for primary care.

## 2024-05-03 NOTE — Telephone Encounter (Signed)
 Roberto Woodard came in hoping for an appt with [facility] today. We are currently booked and pt is unable to make scheduled appt on 6/27 due to work. Ariana requested a refill for pain to pharmacy on file. He stated because of financial reasons he rather come see [facility] and declined Urgent Care, etc. Pt was rescheduled to 8/1 due to needing a Friday appt.

## 2024-05-03 NOTE — Telephone Encounter (Signed)
 Spoke with patient regarding message above. Patient states he is no longer working with pain clinic. Had issues with insurance coverage and out of pocket cost. Was Told he is not able to schedule follow up at [facility] pain clinic.  Does not work with PCP to help with pain management. Did encourage patient to work on establishing care with one to help with pain management moving forward. Understands that this is not something ID typically does, but would route message to provider to advise.  Patient has had to take extra doses of pain meds due to laceration on finger. Seen at [facility] on 04/23/24 for this.  Advised patient go to Urgent Care if pain increases. Would like refills sent to [facility]. Lorenda CHRISTELLA Code, RMA

## 2024-05-05 ENCOUNTER — Other Ambulatory Visit: Payer: Self-pay

## 2024-05-05 ENCOUNTER — Other Ambulatory Visit: Payer: Self-pay | Admitting: Infectious Diseases

## 2024-05-05 DIAGNOSIS — Z21 Asymptomatic human immunodeficiency virus [HIV] infection status: Secondary | ICD-10-CM

## 2024-05-05 MED ORDER — BIKTARVY 50-200-25 MG PO TABS
1.0000 | ORAL_TABLET | Freq: Every day | ORAL | 5 refills | Status: DC
  Filled 2024-05-06: qty 30, 30d supply, fill #0
  Filled 2024-05-30 – 2024-06-03 (×2): qty 30, 30d supply, fill #1

## 2024-05-05 NOTE — Progress Notes (Signed)
 Specialty Pharmacy Refill Coordination Note  Roberto Woodard is a 53 y.o. male contacted today regarding refills of specialty medication(s) Bictegravir-Emtricitab-Tenofov (Biktarvy )   Patient requested Delivery   Delivery date: 05/12/24   Verified address: [address]   [place] [address]   Medication will be filled on 05/11/24.   This fill date is pending response to refill request from provider. Patient is aware and if they have not received fill by intended date they must follow up with pharmacy.

## 2024-05-06 ENCOUNTER — Other Ambulatory Visit: Payer: Self-pay

## 2024-05-06 ENCOUNTER — Ambulatory Visit: Admitting: Infectious Diseases

## 2024-05-30 ENCOUNTER — Other Ambulatory Visit: Payer: Self-pay

## 2024-06-03 ENCOUNTER — Other Ambulatory Visit (HOSPITAL_COMMUNITY): Payer: Self-pay

## 2024-06-07 ENCOUNTER — Other Ambulatory Visit: Payer: Self-pay

## 2024-06-07 ENCOUNTER — Other Ambulatory Visit (HOSPITAL_COMMUNITY): Payer: Self-pay

## 2024-06-07 NOTE — Progress Notes (Signed)
 Specialty Pharmacy Refill Coordination Note  Roberto Woodard is a 53 y.o. male contacted today regarding refills of specialty medication(s) Bictegravir-Emtricitab-Tenofov (Biktarvy )   Patient requested Delivery   Delivery date: 06/08/24   Verified address: [address]   [place] [address]   Medication will be filled on 06/07/24.

## 2024-06-10 ENCOUNTER — Ambulatory Visit: Admitting: Infectious Diseases

## 2024-06-29 ENCOUNTER — Other Ambulatory Visit: Payer: Self-pay

## 2024-06-29 ENCOUNTER — Other Ambulatory Visit (HOSPITAL_COMMUNITY): Payer: Self-pay

## 2024-06-29 ENCOUNTER — Encounter: Payer: Self-pay | Admitting: Infectious Diseases

## 2024-06-29 ENCOUNTER — Ambulatory Visit (INDEPENDENT_AMBULATORY_CARE_PROVIDER_SITE_OTHER): Admitting: Infectious Diseases

## 2024-06-29 VITALS — BP 117/75 | HR 83 | Temp 97.6°F | Ht 66.0 in | Wt 239.0 lb

## 2024-06-29 DIAGNOSIS — Z87891 Personal history of nicotine dependence: Secondary | ICD-10-CM

## 2024-06-29 DIAGNOSIS — Z21 Asymptomatic human immunodeficiency virus [HIV] infection status: Secondary | ICD-10-CM

## 2024-06-29 DIAGNOSIS — Z23 Encounter for immunization: Secondary | ICD-10-CM

## 2024-06-29 DIAGNOSIS — I10 Essential (primary) hypertension: Secondary | ICD-10-CM

## 2024-06-29 DIAGNOSIS — R079 Chest pain, unspecified: Secondary | ICD-10-CM | POA: Diagnosis not present

## 2024-06-29 MED ORDER — SHINGRIX 50 MCG/0.5ML IM SUSR
0.5000 mL | INTRAMUSCULAR | 1 refills | Status: DC
  Filled 2024-06-29: qty 0.5, 56d supply, fill #0

## 2024-06-29 MED ORDER — SHINGRIX 50 MCG/0.5ML IM SUSR: 0.5000 mL | INTRAMUSCULAR | 1 refills | Status: AC

## 2024-06-29 MED ORDER — BIKTARVY 50-200-25 MG PO TABS
1.0000 | ORAL_TABLET | Freq: Every day | ORAL | 11 refills | Status: AC
  Filled 2024-06-29 – 2024-07-04 (×3): qty 30, 30d supply, fill #0
  Filled 2024-08-02: qty 30, 30d supply, fill #1
  Filled 2024-09-09: qty 30, 30d supply, fill #2
  Filled 2024-10-13: qty 30, 30d supply, fill #3
  Filled 2024-11-04 – 2024-11-11 (×2): qty 30, 30d supply, fill #4
  Filled 2024-12-07: qty 30, 30d supply, fill #5

## 2024-06-29 NOTE — Progress Notes (Unsigned)
 Name: Roberto Woodard  DOB: 09-19-1971 MRN: [id number] PCP: Rennis Ade, MD (Inactive)    Brief Narrative:  Roberto Woodard is a 53 y.o. male with HIV infection, non-AIDS; Dx 2005 with CD4 nadir unknown (never on OI proph). HLA B*5701 (+).  HIV Risk: heterosexual.  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   Previous Regimens: Reyataz / Noravir / Truvada --> suppressed 05/2017: Descovy / Prezcobix  --> suppressed (switched to reduce hyperglycemia a/w cobi) Biktarvy  2018 --> suppressed    Genotypes: Records not availible      Subjective:   Chief Complaint  Patient presents with  . Follow-up     Discussed the use of AI scribe software for clinical note transcription with the patient, who gave verbal consent to proceed.  History of Present Illness          Review of Systems  Constitutional:  Positive for unexpected weight change.  Musculoskeletal:  Positive for back pain.  All other systems reviewed and are negative.    Past Medical History:  Diagnosis Date  . 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 Elevated blood pressure reading 06/15/2017  . HIV (human immunodeficiency virus infection) (HCC) 05/25/2017   Dx 2005  . Hyperlipidemia     Outpatient Medications Prior to Visit  Medication Sig Dispense Refill  . albuterol  (VENTOLIN  HFA) 108 (90 Base) MCG/ACT inhaler Inhale 2 puffs into the lungs every 6 (six) hours as needed for wheezing or shortness of breath. 6.7 g 2  . amLODipine  (NORVASC ) 10 MG tablet Take 1 tablet (10 mg total) by mouth daily. 30 tablet 5  . bictegravir-emtricitabine -tenofovir  AF (BIKTARVY ) 50-200-25 MG TABS tablet Take 1 tablet by mouth daily. 30 tablet 5  . lisinopril  (ZESTRIL ) 20 MG tablet Take 1 tablet (20 mg total) by mouth daily. 30 tablet 5  . PARoxetine  (PAXIL ) 20 MG tablet Take 1 tablet (20 mg total) by mouth daily. 30 tablet 5  . cetirizine  (ZYRTEC ) 10 MG  tablet Take 1 tablet (10 mg total) by mouth daily. (Patient not taking: Reported on 06/29/2024) 30 tablet 11  . rosuvastatin  (CRESTOR ) 20 MG tablet Take 1 tablet (20 mg total) by mouth daily. (Patient not taking: Reported on 06/29/2024) 30 tablet 5   No facility-administered medications prior to visit.     Allergies  Allergen Reactions  . Abacavir Anaphylaxis    Positive HLA B*5701  . Metformin  Swelling  . Other     Sweet Peppers and arugala-- Hives and angioedema.     Social History   Tobacco Use  . Smoking status: Former    Types: Cigarettes  . Smokeless tobacco: Never  Substance Use Topics  . Alcohol use: Yes    Comment: wine occasionally   . Drug use: Yes    Types: Marijuana    Comment: twice a day     Social History   Substance and Sexual Activity  Sexual Activity Yes  . Partners: Female  . Birth control/protection: Condom   Comment: declined condoms    Objective:   Vitals:   06/29/24 1508  BP: 117/75  Pulse: 83  Temp: 97.6 F (36.4 C)  TempSrc: Temporal  SpO2: 98%  Weight: 239 lb (108.4 kg)  Height: 5' 6 (1.676 m)    Body mass index is 38.58 kg/m.   Physical Exam Constitutional:      Appearance: Normal appearance. He is not ill-appearing.  HENT:     Head: Normocephalic.  Mouth/Throat:     Mouth: Mucous membranes are moist.     Pharynx: Oropharynx is clear.  Eyes:     General: No scleral icterus. Cardiovascular:     Rate and Rhythm: Normal rate and regular rhythm.  Pulmonary:     Effort: Pulmonary effort is normal.  Musculoskeletal:        General: Normal range of motion.     Cervical back: Normal range of motion.  Skin:    Coloration: Skin is not jaundiced or pale.  Neurological:     Mental Status: He is alert and oriented to person, place, and time.  Psychiatric:        Mood and Affect: Mood normal.        Judgment: Judgment normal.     Lab Results Lab Results  Component Value Date   WBC 5.1 03/17/2023   HGB 13.4  03/17/2023   HCT 41.9 03/17/2023   MCV 84.3 03/17/2023   PLT 309 03/17/2023    Lab Results  Component Value Date   CREATININE 0.84 03/17/2023   BUN 16 03/17/2023   NA 137 03/17/2023   K 4.5 03/17/2023   CL 101 03/17/2023   CO2 25 03/17/2023    Lab Results  Component Value Date   ALT 69 (H) 03/17/2023   AST 43 (H) 03/17/2023   ALKPHOS 97 05/18/2017   BILITOT 0.5 03/17/2023    Lab Results  Component Value Date   CHOL 241 (H) 03/17/2023   HDL 61 03/17/2023   LDLCALC 156 (H) 03/17/2023   TRIG 120 03/17/2023   CHOLHDL 4.0 03/17/2023   HIV 1 RNA Quant  Date Value  03/17/2023 Not Detected Copies/mL  09/23/2022 <20 Copies/mL (H)  04/08/2022 <20 DETECTED copies/mL (A)   CD4 T Cell Abs (/uL)  Date Value  03/17/2023 368 (L)  09/23/2022 433  04/08/2022 418     Assessment & Plan:   Foreign body in eye - No foreign object found; irritation likely due to sliver of metal or other substance.  - Advise saline irrigation to flush the eye.  Chronic pain management - Insurance coverage confirmed for pain management services. Awaiting clinic appointment confirmation. He has been provided with 2 contacts - [facility] at [phone] & [facility] Phone [phone] - Refill oxycocone +APAP BID for two months. -UDS today  - Instruct to contact the pain management clinic to confirm appointment.      FU in 7m for routine HIV care   Roberto Fireman, MSN, NP-C [facility] for Infectious Disease [facility] Pager: [phone] Office: [phone]  06/29/24

## 2024-06-29 NOTE — Patient Instructions (Addendum)
 I would talk about speech therapy for your son   I would feel more comfortable with you being seen by a heart specialist for the pains you mentioned. Anxiety and worry can absolutely cause physical symptoms but I would like to have them check in on you for heart health.   Please stop by the lab on your way out.

## 2024-06-30 ENCOUNTER — Other Ambulatory Visit (HOSPITAL_COMMUNITY): Payer: Self-pay

## 2024-06-30 LAB — T-HELPER CELLS (CD4) COUNT (NOT AT ARMC)
CD4 % Helper T Cell: 36 % (ref 33–65)
CD4 T Cell Abs: 549 /uL (ref 400–1790)

## 2024-07-01 LAB — CBC WITH DIFFERENTIAL/PLATELET
Absolute Lymphocytes: 1612 {cells}/uL (ref 850–3900)
Absolute Monocytes: 664 {cells}/uL (ref 200–950)
Basophils Absolute: 40 {cells}/uL (ref 0–200)
Basophils Relative: 0.5 %
Eosinophils Absolute: 79 {cells}/uL (ref 15–500)
Eosinophils Relative: 1 %
HCT: 41.8 % (ref 38.5–50.0)
Hemoglobin: 13.7 g/dL (ref 13.2–17.1)
MCH: 27.9 pg (ref 27.0–33.0)
MCHC: 32.8 g/dL (ref 32.0–36.0)
MCV: 85.1 fL (ref 80.0–100.0)
MPV: 11.2 fL (ref 7.5–12.5)
Monocytes Relative: 8.4 %
Neutro Abs: 5506 {cells}/uL (ref 1500–7800)
Neutrophils Relative %: 69.7 %
Platelets: 301 Thousand/uL (ref 140–400)
RBC: 4.91 Million/uL (ref 4.20–5.80)
RDW: 13.1 % (ref 11.0–15.0)
Total Lymphocyte: 20.4 %
WBC: 7.9 Thousand/uL (ref 3.8–10.8)

## 2024-07-01 LAB — COMPLETE METABOLIC PANEL WITHOUT GFR
AG Ratio: 1.5 (calc) (ref 1.0–2.5)
ALT: 31 U/L (ref 9–46)
AST: 31 U/L (ref 10–35)
Albumin: 4.8 g/dL (ref 3.6–5.1)
Alkaline phosphatase (APISO): 76 U/L (ref 35–144)
BUN: 15 mg/dL (ref 7–25)
CO2: 26 mmol/L (ref 20–32)
Calcium: 10.1 mg/dL (ref 8.6–10.3)
Chloride: 102 mmol/L (ref 98–110)
Creat: 0.84 mg/dL (ref 0.70–1.30)
Globulin: 3.2 g/dL (ref 1.9–3.7)
Glucose, Bld: 116 mg/dL — ABNORMAL HIGH (ref 65–99)
Potassium: 4.2 mmol/L (ref 3.5–5.3)
Sodium: 136 mmol/L (ref 135–146)
Total Bilirubin: 0.5 mg/dL (ref 0.2–1.2)
Total Protein: 8 g/dL (ref 6.1–8.1)

## 2024-07-01 LAB — HIV-1 RNA QUANT-NO REFLEX-BLD
HIV 1 RNA Quant: NOT DETECTED {copies}/mL
HIV-1 RNA Quant, Log: NOT DETECTED {Log_copies}/mL

## 2024-07-01 LAB — HIGH SENSITIVITY CRP: hs-CRP: 3.8 mg/L — ABNORMAL HIGH

## 2024-07-04 ENCOUNTER — Other Ambulatory Visit: Payer: Self-pay

## 2024-07-04 NOTE — Progress Notes (Signed)
 Specialty Pharmacy Refill Coordination Note  Edrees Valent is a 53 y.o. male contacted today regarding refills of specialty medication(s) Bictegravir-Emtricitab-Tenofov (Biktarvy )   Patient requested Delivery   Delivery date: 07/08/24   Verified address: [address]   [place] [address]   Medication will be filled on 08/28.25.

## 2024-07-06 ENCOUNTER — Other Ambulatory Visit: Payer: Self-pay

## 2024-07-29 ENCOUNTER — Other Ambulatory Visit: Payer: Self-pay

## 2024-08-02 ENCOUNTER — Other Ambulatory Visit: Payer: Self-pay | Admitting: Pharmacy Technician

## 2024-08-02 ENCOUNTER — Other Ambulatory Visit: Payer: Self-pay

## 2024-08-02 NOTE — Progress Notes (Signed)
 Specialty Pharmacy Refill Coordination Note  Roberto Woodard is a 53 y.o. male contacted today regarding refills of specialty medication(s) Bictegravir-Emtricitab-Tenofov (Biktarvy )   Patient requested Delivery   Delivery date: 08/12/24   Verified address: [address]   [place] [address]   Medication will be filled on 08/11/24.  Patient request that all his maintenance meds & speciality be shipped together.

## 2024-09-02 ENCOUNTER — Other Ambulatory Visit (HOSPITAL_COMMUNITY): Payer: Self-pay

## 2024-09-09 ENCOUNTER — Other Ambulatory Visit: Payer: Self-pay

## 2024-09-13 ENCOUNTER — Other Ambulatory Visit (HOSPITAL_COMMUNITY): Payer: Self-pay

## 2024-09-15 ENCOUNTER — Other Ambulatory Visit: Payer: Self-pay

## 2024-09-15 NOTE — Progress Notes (Signed)
 Specialty Pharmacy Refill Coordination Note  Roberto Woodard is a 53 y.o. male contacted today regarding refills of specialty medication(s) Bictegravir-Emtricitab-Tenofov (Biktarvy )   Patient requested Delivery   Delivery date: 09/16/24   Verified address: [address]   [place] [address]   Medication will be filled on: 09/15/24

## 2024-09-26 ENCOUNTER — Encounter: Payer: Self-pay | Admitting: Internal Medicine

## 2024-09-26 ENCOUNTER — Ambulatory Visit: Attending: Internal Medicine | Admitting: Internal Medicine

## 2024-09-26 VITALS — BP 162/82 | HR 86 | Ht 66.0 in | Wt 245.4 lb

## 2024-09-26 DIAGNOSIS — R079 Chest pain, unspecified: Secondary | ICD-10-CM | POA: Insufficient documentation

## 2024-09-26 DIAGNOSIS — E7800 Pure hypercholesterolemia, unspecified: Secondary | ICD-10-CM | POA: Diagnosis not present

## 2024-09-26 MED ORDER — REPATHA SURECLICK 140 MG/ML ~~LOC~~ SOAJ: 140.0000 mg | SUBCUTANEOUS | 2 refills | Status: AC

## 2024-09-26 NOTE — Patient Instructions (Signed)
 Medication Instructions:  Your physician has recommended you make the following change in your medication:   -Start Repatha 140 mg/mL injections. Inject 1 pen into the skin every 14 days.   *If you need a refill on your cardiac medications before your next appointment, please call your pharmacy*  Lab Work: Lipid Panel- FASTING- nothing to eat/drink at least 6 hours prior to having labs drawn.   If you have labs (blood work) drawn today and your tests are completely normal, you will receive your results only by: MyChart Message (if you have MyChart) OR A paper copy in the mail If you have any lab test that is abnormal or we need to change your treatment, we will call you to review the results.  Testing/Procedures: Your physician has requested that you have an echocardiogram. Echocardiography is a painless test that uses sound waves to create images of your heart. It provides your doctor with information about the size and shape of your heart and how well your heart's chambers and valves are working. This procedure takes approximately one hour. There are no restrictions for this procedure. Please do NOT wear cologne, perfume, aftershave, or lotions (deodorant is allowed). Please arrive 15 minutes prior to your appointment time.  Please note: We ask at that you not bring children with you during ultrasound (echo/ vascular) testing. Due to room size and safety concerns, children are not allowed in the ultrasound rooms during exams. Our front office staff cannot provide observation of children in our lobby area while testing is being conducted. An adult accompanying a patient to their appointment will only be allowed in the ultrasound room at the discretion of the ultrasound technician under special circumstances. We apologize for any inconvenience. Your physician has requested that you have a lexiscan myoview. For further information please visit [email or web address] Please follow instruction sheet,  as given.   Follow-Up: At [facility], you and your health needs are our priority.  As part of our continuing mission to provide you with exceptional heart care, our providers are all part of one team.  This team includes your primary Cardiologist (physician) and Advanced Practice Providers or APPs (Physician Assistants and Nurse Practitioners) who all work together to provide you with the care you need, when you need it.  Your next appointment:   6 month(s)  Provider:   You may see Vishnu Mallipeddi, MD or one of the following Advanced Practice Providers on your designated Care Team:   Brittany Strader, PA-C  Scotesia Babbie, [place] Olivia Pavy, [place]     We recommend signing up for the patient portal called MyChart.  Sign up information is provided on this After Visit Summary.  MyChart is used to connect with patients for Virtual Visits (Telemedicine).  Patients are able to view lab/test results, encounter notes, upcoming appointments, etc.  Non-urgent messages can be sent to your provider as well.   To learn more about what you can do with MyChart, go to [email or web address].   Other Instructions
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
## 2024-09-28 ENCOUNTER — Telehealth: Payer: Self-pay | Admitting: Pharmacy Technician

## 2024-09-28 ENCOUNTER — Other Ambulatory Visit (HOSPITAL_COMMUNITY): Payer: Self-pay

## 2024-09-28 NOTE — Telephone Encounter (Signed)
 Hi, insurance is asking for labs sent showing treatment of inadequate control on statin AND ezetimibe combination. I don't see here he has been on zetia? Unfortunately he would have to be on both before they would approve the Repatha.   Pharmacy Patient Advocate Encounter   Received notification from CoverMyMeds that prior authorization for repatha is required/requested.   Insurance verification completed.   The patient is insured through [facility] MEDICAID.   Per test claim: PA required; PA started via CoverMyMeds. KEY [id number] . Please see clinical question(s) below that I am not finding the answer to in their chart and advise.

## 2024-09-28 NOTE — Telephone Encounter (Addendum)
 [facility] Specialty Insurance Team calling to advise office preauth has been faxed. Please advise.   [phone] Reference [id number]

## 2024-09-28 NOTE — Telephone Encounter (Signed)
 Pharmacy Patient Advocate Encounter   Received notification from Pt Calls Messages that prior authorization for repatha is required/requested.   Insurance verification completed.   The patient is insured through [facility] MEDICAID.   Per test claim: PA required; PA submitted to above mentioned insurance via Latent Key/confirmation #/EOC [id number] Status is pending   Sent with the note from the doctor

## 2024-09-28 NOTE — Telephone Encounter (Signed)
 Pharmacy Patient Advocate Encounter  Received notification from [facility] MEDICAID that Prior Authorization for Repatha has been APPROVED from 09/28/24 to 09/28/25. Spoke to pharmacy to process.Copay is $4.00.    PA #/Case ID/Reference #: [id number]

## 2024-09-29 ENCOUNTER — Ambulatory Visit: Admitting: Infectious Diseases

## 2024-10-05 ENCOUNTER — Ambulatory Visit: Admitting: Internal Medicine

## 2024-10-07 ENCOUNTER — Other Ambulatory Visit (HOSPITAL_COMMUNITY): Payer: Self-pay

## 2024-10-13 ENCOUNTER — Other Ambulatory Visit: Payer: Self-pay

## 2024-10-13 NOTE — Progress Notes (Signed)
 Specialty Pharmacy Refill Coordination Note  Roberto Woodard is a 53 y.o. male contacted today regarding refills of specialty medication(s) Bictegravir-Emtricitab-Tenofov (Biktarvy )   Patient requested Delivery   Delivery date: 10/17/24   Verified address: [address]   [place] [address]   Medication will be filled on: 10/14/24

## 2024-10-14 ENCOUNTER — Other Ambulatory Visit: Payer: Self-pay

## 2024-10-24 ENCOUNTER — Ambulatory Visit (HOSPITAL_COMMUNITY)

## 2024-10-24 ENCOUNTER — Encounter (HOSPITAL_COMMUNITY)

## 2024-10-24 ENCOUNTER — Encounter (HOSPITAL_COMMUNITY): Admission: RE | Admit: 2024-10-24 | Source: Ambulatory Visit

## 2024-10-28 ENCOUNTER — Encounter (HOSPITAL_COMMUNITY)

## 2024-11-04 ENCOUNTER — Other Ambulatory Visit (HOSPITAL_COMMUNITY): Payer: Self-pay

## 2024-11-11 ENCOUNTER — Other Ambulatory Visit (HOSPITAL_COMMUNITY): Payer: Self-pay

## 2024-11-11 ENCOUNTER — Other Ambulatory Visit: Payer: Self-pay | Admitting: Infectious Diseases

## 2024-11-11 DIAGNOSIS — I10 Essential (primary) hypertension: Secondary | ICD-10-CM

## 2024-11-11 DIAGNOSIS — F32A Depression, unspecified: Secondary | ICD-10-CM

## 2024-11-11 NOTE — Progress Notes (Signed)
 Specialty Pharmacy Refill Coordination Note  Roberto Woodard is a 54 y.o. male contacted today regarding refills of specialty medication(s) Bictegravir-Emtricitab-Tenofov (Biktarvy )   Patient requested Delivery   Delivery date: 11/15/24   Verified address: [address]   [place] [address]   Medication will be filled on: 11/14/24

## 2024-11-14 ENCOUNTER — Other Ambulatory Visit: Payer: Self-pay

## 2024-11-14 MED ORDER — LISINOPRIL 20 MG PO TABS
20.0000 mg | ORAL_TABLET | Freq: Every day | ORAL | 5 refills | Status: AC
  Filled 2024-11-14: qty 30, 30d supply, fill #0
  Filled 2024-12-07 – 2024-12-08 (×2): qty 30, 30d supply, fill #1
  Filled ????-??-??: fill #1

## 2024-11-14 MED ORDER — AMLODIPINE BESYLATE 10 MG PO TABS
10.0000 mg | ORAL_TABLET | Freq: Every day | ORAL | 5 refills | Status: AC
  Filled 2024-11-14: qty 30, 30d supply, fill #0
  Filled 2024-12-07 – 2024-12-08 (×2): qty 30, 30d supply, fill #1
  Filled ????-??-??: fill #1

## 2024-11-14 MED ORDER — PAROXETINE HCL 20 MG PO TABS
20.0000 mg | ORAL_TABLET | Freq: Every day | ORAL | 5 refills | Status: AC
  Filled 2024-11-14: qty 30, 30d supply, fill #0
  Filled 2024-12-07 – 2024-12-08 (×2): qty 30, 30d supply, fill #1
  Filled ????-??-??: fill #1

## 2024-11-14 NOTE — Telephone Encounter (Signed)
"  Okay to refill?  "

## 2024-11-15 ENCOUNTER — Other Ambulatory Visit: Payer: Self-pay

## 2024-11-15 ENCOUNTER — Other Ambulatory Visit (HOSPITAL_COMMUNITY): Payer: Self-pay

## 2024-11-24 ENCOUNTER — Encounter (HOSPITAL_COMMUNITY)

## 2024-11-24 ENCOUNTER — Other Ambulatory Visit (HOSPITAL_COMMUNITY)

## 2024-11-25 ENCOUNTER — Encounter (HOSPITAL_COMMUNITY)

## 2024-11-25 ENCOUNTER — Ambulatory Visit (HOSPITAL_COMMUNITY)

## 2024-11-28 ENCOUNTER — Telehealth: Payer: Self-pay | Admitting: Internal Medicine

## 2024-11-28 NOTE — Telephone Encounter (Signed)
 Checking percert on the following patient for testing scheduled at [facility].    LEXISCAN   12/12/2024

## 2024-12-01 ENCOUNTER — Ambulatory Visit (HOSPITAL_COMMUNITY): Admission: RE | Admit: 2024-12-01 | Source: Ambulatory Visit

## 2024-12-07 ENCOUNTER — Other Ambulatory Visit: Payer: Self-pay

## 2024-12-07 ENCOUNTER — Other Ambulatory Visit (HOSPITAL_COMMUNITY): Payer: Self-pay

## 2024-12-07 ENCOUNTER — Other Ambulatory Visit: Payer: Self-pay | Admitting: Pharmacy Technician

## 2024-12-07 NOTE — Progress Notes (Signed)
 Specialty Pharmacy Refill Coordination Note  Roberto Woodard is a 54 y.o. male contacted today regarding refills of specialty medication(s) Bictegravir-Emtricitab-Tenofov (Biktarvy )   Patient requested Delivery   Delivery date: 12/09/24   Verified address: [address]   [place] [address]   Medication will be filled on: 12/08/24

## 2024-12-08 ENCOUNTER — Other Ambulatory Visit: Payer: Self-pay

## 2024-12-09 ENCOUNTER — Telehealth (HOSPITAL_COMMUNITY): Payer: Self-pay

## 2024-12-09 NOTE — Telephone Encounter (Signed)
 Called patient to remind him of his stress test on Monday, didn't answer so left a voicemail. Explained instructions and advised to call [phone] on Monday to make sure we are open due to potential winter weather.

## 2024-12-12 ENCOUNTER — Encounter (HOSPITAL_COMMUNITY): Admission: RE | Admit: 2024-12-12 | Source: Ambulatory Visit

## 2024-12-12 ENCOUNTER — Encounter (HOSPITAL_COMMUNITY)
